# Patient Record
Sex: Female | Born: 1995 | Race: Black or African American | Hispanic: Yes | Smoking: Never smoker
Health system: Southern US, Community
[De-identification: ages and names within clinical notes are randomized; demographics above are authoritative.]

## PROBLEM LIST (undated history)

## (undated) DIAGNOSIS — D219 Benign neoplasm of connective and other soft tissue, unspecified: Secondary | ICD-10-CM

## (undated) DIAGNOSIS — R011 Cardiac murmur, unspecified: Secondary | ICD-10-CM

## (undated) DIAGNOSIS — D649 Anemia, unspecified: Secondary | ICD-10-CM

## (undated) DIAGNOSIS — E669 Obesity, unspecified: Secondary | ICD-10-CM

## (undated) DIAGNOSIS — R42 Dizziness and giddiness: Secondary | ICD-10-CM

## (undated) DIAGNOSIS — L732 Hidradenitis suppurativa: Secondary | ICD-10-CM

## (undated) DIAGNOSIS — R519 Headache, unspecified: Secondary | ICD-10-CM

## (undated) HISTORY — DX: Anemia, unspecified: D64.9

## (undated) HISTORY — DX: Hidradenitis suppurativa: L73.2

## (undated) HISTORY — DX: Headache, unspecified: R51.9

## (undated) HISTORY — DX: Cardiac murmur, unspecified: R01.1

## (undated) HISTORY — DX: Obesity, unspecified: E66.9

## (undated) HISTORY — DX: Benign neoplasm of connective and other soft tissue, unspecified: D21.9

## (undated) HISTORY — PX: TEAR DUCT PROBING: SHX793

---

## 2013-07-06 HISTORY — PX: WISDOM TOOTH EXTRACTION: SHX21

## 2020-07-15 ENCOUNTER — Emergency Department (HOSPITAL_COMMUNITY)
Admission: EM | Admit: 2020-07-15 | Discharge: 2020-07-15 | Disposition: A | Discharge status: Home / Self Care | Payer: Medicaid Other | Attending: Emergency Medicine | Admitting: Emergency Medicine

## 2020-07-15 ENCOUNTER — Other Ambulatory Visit: Payer: Self-pay

## 2020-07-15 ENCOUNTER — Encounter (HOSPITAL_COMMUNITY): Payer: Self-pay

## 2020-07-15 DIAGNOSIS — Z5321 Procedure and treatment not carried out due to patient leaving prior to being seen by health care provider: Secondary | ICD-10-CM | POA: Diagnosis not present

## 2020-07-15 DIAGNOSIS — R42 Dizziness and giddiness: Secondary | ICD-10-CM | POA: Diagnosis not present

## 2020-07-15 DIAGNOSIS — Z3201 Encounter for pregnancy test, result positive: Secondary | ICD-10-CM | POA: Diagnosis not present

## 2020-07-15 LAB — I-STAT BETA HCG BLOOD, ED (MC, WL, AP ONLY): I-stat hCG, quantitative: >2000 m[IU]/mL — ABNORMAL HIGH (ref <5)

## 2020-07-15 NOTE — ED Notes (Signed)
Pt notified staff that she was leaving 

## 2020-07-15 NOTE — ED Triage Notes (Addendum)
Pt here today requesting a pregnancy test. Pt had a positive pregnancy test at home 2 weeks ago.  Pt also having concerns with her vertigo

## 2020-08-12 LAB — OB RESULTS CONSOLE RPR: RPR: NONREACTIVE

## 2020-08-12 LAB — OB RESULTS CONSOLE HIV ANTIBODY (ROUTINE TESTING): HIV: NONREACTIVE

## 2020-08-12 LAB — OB RESULTS CONSOLE ABO/RH: RH Type: POSITIVE

## 2020-08-12 LAB — OB RESULTS CONSOLE HEPATITIS B SURFACE ANTIGEN: Hepatitis B Surface Ag: NEGATIVE

## 2020-08-12 LAB — OB RESULTS CONSOLE RUBELLA ANTIBODY, IGM: Rubella: IMMUNE

## 2020-08-12 LAB — OB RESULTS CONSOLE ANTIBODY SCREEN: Antibody Screen: NEGATIVE

## 2020-08-12 LAB — OB RESULTS CONSOLE GC/CHLAMYDIA
Chlamydia: NEGATIVE
Gonorrhea: NEGATIVE

## 2020-08-24 ENCOUNTER — Encounter (HOSPITAL_COMMUNITY): Payer: Self-pay | Admitting: Emergency Medicine

## 2020-08-24 ENCOUNTER — Other Ambulatory Visit: Payer: Self-pay

## 2020-08-24 ENCOUNTER — Emergency Department (HOSPITAL_COMMUNITY)
Admission: EM | Admit: 2020-08-24 | Discharge: 2020-08-24 | Disposition: A | Discharge status: Home / Self Care | Payer: Medicaid Other | Attending: Emergency Medicine | Admitting: Emergency Medicine

## 2020-08-24 DIAGNOSIS — R1031 Right lower quadrant pain: Secondary | ICD-10-CM | POA: Diagnosis present

## 2020-08-24 DIAGNOSIS — R110 Nausea: Secondary | ICD-10-CM | POA: Diagnosis not present

## 2020-08-24 DIAGNOSIS — R102 Pelvic and perineal pain: Secondary | ICD-10-CM | POA: Diagnosis not present

## 2020-08-24 LAB — COMPREHENSIVE METABOLIC PANEL
ALT: 10 U/L (ref 0–44)
AST: 12 U/L — ABNORMAL LOW (ref 15–41)
Albumin: 3.5 g/dL (ref 3.5–5.0)
Alkaline Phosphatase: 37 U/L — ABNORMAL LOW (ref 38–126)
Anion gap: 8 (ref 5–15)
BUN: 11 mg/dL (ref 6–20)
CO2: 23 mmol/L (ref 22–32)
Calcium: 8.9 mg/dL (ref 8.9–10.3)
Chloride: 106 mmol/L (ref 98–111)
Creatinine, Ser: 0.63 mg/dL (ref 0.44–1.00)
GFR, Estimated: >60 mL/min (ref >60)
Glucose, Bld: 91 mg/dL (ref 70–99)
Potassium: 3.8 mmol/L (ref 3.5–5.1)
Sodium: 137 mmol/L (ref 135–145)
Total Bilirubin: 0.4 mg/dL (ref 0.3–1.2)
Total Protein: 6.6 g/dL (ref 6.5–8.1)

## 2020-08-24 LAB — URINALYSIS, ROUTINE W REFLEX MICROSCOPIC
Bilirubin Urine: NEGATIVE
Glucose, UA: NEGATIVE mg/dL
Hgb urine dipstick: NEGATIVE
Ketones, ur: NEGATIVE mg/dL
Leukocytes,Ua: NEGATIVE
Nitrite: NEGATIVE
Protein, ur: NEGATIVE mg/dL
Specific Gravity, Urine: 1.015 (ref 1.005–1.030)
pH: 7 (ref 5.0–8.0)

## 2020-08-24 LAB — CBC
HCT: 35.4 % — ABNORMAL LOW (ref 36.0–46.0)
Hemoglobin: 11.6 g/dL — ABNORMAL LOW (ref 12.0–15.0)
MCH: 28.4 pg (ref 26.0–34.0)
MCHC: 32.8 g/dL (ref 30.0–36.0)
MCV: 86.6 fL (ref 80.0–100.0)
Platelets: 195 10*3/uL (ref 150–400)
RBC: 4.09 MIL/uL (ref 3.87–5.11)
RDW: 12.5 % (ref 11.5–15.5)
WBC: 8.9 10*3/uL (ref 4.0–10.5)
nRBC: 0 % (ref 0.0–0.2)

## 2020-08-24 LAB — HCG, QUANTITATIVE, PREGNANCY: hCG, Beta Chain, Quant, S: 51333 m[IU]/mL — ABNORMAL HIGH (ref <5)

## 2020-08-24 LAB — LIPASE, BLOOD: Lipase: 34 U/L (ref 11–51)

## 2020-08-24 MED ORDER — AZITHROMYCIN 250 MG PO TABS
1000.0000 mg | ORAL_TABLET | Freq: Once | ORAL | Status: AC
  Administered 2020-08-24: 1000 mg via ORAL
  Filled 2020-08-24: qty 4

## 2020-08-24 NOTE — ED Notes (Signed)
Pt provided labeled specimen cup for urine collection. [employer]

## 2020-08-24 NOTE — ED Triage Notes (Signed)
Patient complaining of right side abdominal pain that started 40 min ago. Patient states that it is a shooting pain.

## 2020-08-24 NOTE — ED Provider Notes (Signed)
[facility] DEPT Provider Note   CSN: [id number] Arrival date & time: 08/24/20  0035     History Chief Complaint  Patient presents with  . Abdominal Pain    Kimberly Padilla is a 25 y.o. female.   Abdominal Pain Pain location:  RLQ Pain quality: aching, sharp and shooting   Pain radiates to:  Does not radiate Pain severity:  Moderate Duration:  1 hour Timing:  Constant Chronicity:  New Context: not trauma   Relieved by:  Nothing Worsened by:  Nothing Ineffective treatments:  None tried Associated symptoms: nausea   Associated symptoms: no anorexia, no chest pain, no chills, no cough, no diarrhea, no dysuria, no fever, no shortness of breath, no vaginal bleeding, no vaginal discharge and no vomiting        History reviewed. No pertinent past medical history.  There are no problems to display for this patient.   History reviewed. No pertinent surgical history.   OB History    Gravida  1   Para      Term      Preterm      AB      Living        SAB      IAB      Ectopic      Multiple      Live Births              History reviewed. No pertinent family history.  Social History   Tobacco Use  . Smoking status: Never Smoker  . Smokeless tobacco: Never Used  Substance Use Topics  . Alcohol use: Never  . Drug use: Never    Home Medications Prior to Admission medications   Not on File    Allergies    Patient has no known allergies.  Review of Systems   Review of Systems  Constitutional: Negative for chills and fever.  HENT: Negative for congestion and rhinorrhea.   Respiratory: Negative for cough and shortness of breath.   Cardiovascular: Negative for chest pain and palpitations.  Gastrointestinal: Positive for abdominal pain and nausea. Negative for anorexia, diarrhea and vomiting.  Genitourinary: Positive for pelvic pain. Negative for difficulty urinating, dysuria, vaginal bleeding and vaginal  discharge.  Musculoskeletal: Negative for arthralgias and back pain.  Skin: Negative for rash and wound.  Neurological: Negative for light-headedness and headaches.    Physical Exam Updated Vital Signs BP 109/61   Pulse 76   Temp 98.1 F (36.7 C) (Oral)   Resp 15   Ht 5\' 8"  (1.727 m)   Wt 106.6 kg   LMP 04/14/2020   SpO2 99%   BMI 35.73 kg/m   Physical Exam Vitals and nursing note reviewed. Exam conducted with a chaperone present.  Constitutional:      General: She is not in acute distress.    Appearance: Normal appearance.  HENT:     Head: Normocephalic and atraumatic.     Nose: No rhinorrhea.  Eyes:     General:        Right eye: No discharge.        Left eye: No discharge.     Conjunctiva/sclera: Conjunctivae normal.  Cardiovascular:     Rate and Rhythm: Normal rate and regular rhythm.  Pulmonary:     Effort: Pulmonary effort is normal. No respiratory distress.     Breath sounds: No stridor.  Abdominal:     General: Abdomen is flat. There is no distension.  Palpations: Abdomen is soft.     Tenderness: There is no guarding or rebound. Negative signs include Murphy's sign and Rovsing's sign.  Musculoskeletal:        General: No tenderness or signs of injury.  Skin:    General: Skin is warm and dry.  Neurological:     General: No focal deficit present.     Mental Status: She is alert. Mental status is at baseline.     Motor: No weakness.  Psychiatric:        Mood and Affect: Mood normal.        Behavior: Behavior normal.     ED Results / Procedures / Treatments   Labs (all labs ordered are listed, but only abnormal results are displayed) Labs Reviewed  COMPREHENSIVE METABOLIC PANEL - Abnormal; Notable for the following components:      Result Value   AST 12 (*)    Alkaline Phosphatase 37 (*)    All other components within normal limits  CBC - Abnormal; Notable for the following components:   Hemoglobin 11.6 (*)    HCT 35.4 (*)    All other  components within normal limits  HCG, QUANTITATIVE, PREGNANCY - Abnormal; Notable for the following components:   hCG, Beta Chain, Quant, S 51,333 (*)    All other components within normal limits  LIPASE, BLOOD  URINALYSIS, ROUTINE W REFLEX MICROSCOPIC    EKG None  Radiology No results found.  Procedures Procedures   Medications Ordered in ED Medications  azithromycin (ZITHROMAX) tablet 1,000 mg (1,000 mg Oral Given 08/24/20 0248)    ED Course  I have reviewed the triage vital signs and the nursing notes.  Pertinent labs & imaging results that were available during my care of the patient were reviewed by me and considered in my medical decision making (see chart for details).    MDM Rules/Calculators/A&P                          14w preg G1P0, with sudden onset right pelvic pain, no bleeding, no trauma no recent illness.  Recently diagnosed with chlamydia, not treated yet.  Patient's abdomen is soft no signs of peritonitis.  We will get urine studies as she is having some urinary frequency.  No vaginal discharge.  Has had a confirmatory ultrasound.  Patient's pregnancy is not a viable age.  Will likely help follow-up laboratory studies are unremarkable.  Laboratory studies are unremarkable for any acute changes.  Show concerning signs for infection.  Fetal heart tones are documented at 150.  Patient is feeling much better.  She states this all started after some stressful life events and she is feeling better now.  She is given follow-up information for emergent OB care and invited to come back to [place] at any point.  She agrees she is safe for discharge strict return precautions given she is treated for chlamydia as she had a positive test at home.  No further testing needed now she has good OB follow-up.  Final Clinical Impression(s) / ED Diagnoses Final diagnoses:  Pelvic pain    Rx / DC Orders ED Discharge Orders    None       Breck Coons, MD 08/24/20 [phone]

## 2020-08-24 NOTE — ED Notes (Signed)
Fetal Doppler heart rate 150.

## 2020-08-24 NOTE — Discharge Instructions (Addendum)
[facility] [address] [place],  [place]  [address]  Phone number: [phone]

## 2020-09-10 ENCOUNTER — Other Ambulatory Visit: Payer: Self-pay | Admitting: Obstetrics & Gynecology

## 2020-09-10 DIAGNOSIS — O99212 Obesity complicating pregnancy, second trimester: Secondary | ICD-10-CM

## 2020-09-30 ENCOUNTER — Encounter: Payer: Self-pay | Admitting: *Deleted

## 2020-10-01 ENCOUNTER — Other Ambulatory Visit: Payer: Self-pay

## 2020-10-01 ENCOUNTER — Ambulatory Visit: Payer: Medicaid Other | Admitting: *Deleted

## 2020-10-01 ENCOUNTER — Ambulatory Visit: Payer: Medicaid Other | Attending: Obstetrics and Gynecology

## 2020-10-01 ENCOUNTER — Other Ambulatory Visit: Payer: Self-pay | Admitting: *Deleted

## 2020-10-01 ENCOUNTER — Encounter: Payer: Self-pay | Admitting: *Deleted

## 2020-10-01 VITALS — BP 127/49 | HR 85

## 2020-10-01 DIAGNOSIS — D259 Leiomyoma of uterus, unspecified: Secondary | ICD-10-CM

## 2020-10-01 DIAGNOSIS — E669 Obesity, unspecified: Secondary | ICD-10-CM

## 2020-10-01 DIAGNOSIS — O99212 Obesity complicating pregnancy, second trimester: Secondary | ICD-10-CM | POA: Insufficient documentation

## 2020-10-01 DIAGNOSIS — Z363 Encounter for antenatal screening for malformations: Secondary | ICD-10-CM | POA: Diagnosis not present

## 2020-10-01 DIAGNOSIS — O3412 Maternal care for benign tumor of corpus uteri, second trimester: Secondary | ICD-10-CM

## 2020-10-01 DIAGNOSIS — D219 Benign neoplasm of connective and other soft tissue, unspecified: Secondary | ICD-10-CM

## 2020-10-01 DIAGNOSIS — Z3A19 19 weeks gestation of pregnancy: Secondary | ICD-10-CM

## 2020-10-01 DIAGNOSIS — O321XX0 Maternal care for breech presentation, not applicable or unspecified: Secondary | ICD-10-CM

## 2020-10-29 ENCOUNTER — Other Ambulatory Visit: Payer: Self-pay

## 2020-10-29 ENCOUNTER — Encounter: Payer: Self-pay | Admitting: *Deleted

## 2020-10-29 ENCOUNTER — Other Ambulatory Visit: Payer: Self-pay | Admitting: *Deleted

## 2020-10-29 ENCOUNTER — Ambulatory Visit: Payer: Medicaid Other | Admitting: *Deleted

## 2020-10-29 ENCOUNTER — Ambulatory Visit: Payer: Medicaid Other | Attending: Obstetrics

## 2020-10-29 VITALS — BP 114/53 | HR 78

## 2020-10-29 DIAGNOSIS — O99212 Obesity complicating pregnancy, second trimester: Secondary | ICD-10-CM | POA: Diagnosis not present

## 2020-10-29 DIAGNOSIS — O3412 Maternal care for benign tumor of corpus uteri, second trimester: Secondary | ICD-10-CM | POA: Diagnosis not present

## 2020-10-29 DIAGNOSIS — D219 Benign neoplasm of connective and other soft tissue, unspecified: Secondary | ICD-10-CM

## 2020-10-29 DIAGNOSIS — Z3A23 23 weeks gestation of pregnancy: Secondary | ICD-10-CM | POA: Insufficient documentation

## 2020-10-29 DIAGNOSIS — E669 Obesity, unspecified: Secondary | ICD-10-CM

## 2020-10-29 DIAGNOSIS — O289 Unspecified abnormal findings on antenatal screening of mother: Secondary | ICD-10-CM | POA: Diagnosis not present

## 2020-10-29 DIAGNOSIS — D251 Intramural leiomyoma of uterus: Secondary | ICD-10-CM | POA: Diagnosis not present

## 2020-10-29 DIAGNOSIS — D259 Leiomyoma of uterus, unspecified: Secondary | ICD-10-CM

## 2020-12-20 ENCOUNTER — Other Ambulatory Visit: Payer: Self-pay

## 2020-12-20 ENCOUNTER — Ambulatory Visit: Payer: Medicaid Other | Attending: Obstetrics and Gynecology

## 2020-12-20 ENCOUNTER — Ambulatory Visit: Payer: Medicaid Other | Admitting: *Deleted

## 2020-12-20 ENCOUNTER — Encounter: Payer: Self-pay | Admitting: *Deleted

## 2020-12-20 VITALS — BP 123/55 | HR 89

## 2020-12-20 DIAGNOSIS — O3413 Maternal care for benign tumor of corpus uteri, third trimester: Secondary | ICD-10-CM

## 2020-12-20 DIAGNOSIS — O99213 Obesity complicating pregnancy, third trimester: Secondary | ICD-10-CM

## 2020-12-20 DIAGNOSIS — O289 Unspecified abnormal findings on antenatal screening of mother: Secondary | ICD-10-CM | POA: Diagnosis not present

## 2020-12-20 DIAGNOSIS — D219 Benign neoplasm of connective and other soft tissue, unspecified: Secondary | ICD-10-CM | POA: Diagnosis present

## 2020-12-20 DIAGNOSIS — Z362 Encounter for other antenatal screening follow-up: Secondary | ICD-10-CM

## 2020-12-20 DIAGNOSIS — D259 Leiomyoma of uterus, unspecified: Secondary | ICD-10-CM

## 2020-12-20 DIAGNOSIS — E669 Obesity, unspecified: Secondary | ICD-10-CM

## 2020-12-20 DIAGNOSIS — O321XX0 Maternal care for breech presentation, not applicable or unspecified: Secondary | ICD-10-CM

## 2020-12-20 DIAGNOSIS — Z3A31 31 weeks gestation of pregnancy: Secondary | ICD-10-CM

## 2021-01-08 ENCOUNTER — Other Ambulatory Visit: Payer: Self-pay

## 2021-01-08 ENCOUNTER — Inpatient Hospital Stay (HOSPITAL_COMMUNITY)
Admission: AD | Admit: 2021-01-08 | Discharge: 2021-01-09 | Disposition: A | Discharge status: Home / Self Care | Payer: Medicaid Other | Attending: Obstetrics | Admitting: Obstetrics

## 2021-01-08 DIAGNOSIS — Z20822 Contact with and (suspected) exposure to covid-19: Secondary | ICD-10-CM | POA: Diagnosis not present

## 2021-01-08 DIAGNOSIS — O99891 Other specified diseases and conditions complicating pregnancy: Secondary | ICD-10-CM | POA: Diagnosis not present

## 2021-01-08 DIAGNOSIS — O99213 Obesity complicating pregnancy, third trimester: Secondary | ICD-10-CM | POA: Diagnosis not present

## 2021-01-08 DIAGNOSIS — Z7982 Long term (current) use of aspirin: Secondary | ICD-10-CM | POA: Insufficient documentation

## 2021-01-08 DIAGNOSIS — O99283 Endocrine, nutritional and metabolic diseases complicating pregnancy, third trimester: Secondary | ICD-10-CM | POA: Insufficient documentation

## 2021-01-08 DIAGNOSIS — Z3A34 34 weeks gestation of pregnancy: Secondary | ICD-10-CM | POA: Diagnosis not present

## 2021-01-08 DIAGNOSIS — R42 Dizziness and giddiness: Secondary | ICD-10-CM | POA: Diagnosis not present

## 2021-01-08 DIAGNOSIS — E860 Dehydration: Secondary | ICD-10-CM | POA: Insufficient documentation

## 2021-01-08 DIAGNOSIS — O26893 Other specified pregnancy related conditions, third trimester: Secondary | ICD-10-CM | POA: Insufficient documentation

## 2021-01-08 HISTORY — DX: Dizziness and giddiness: R42

## 2021-01-08 MED ORDER — LACTATED RINGERS IV SOLN: Freq: Once | INTRAVENOUS | Status: AC

## 2021-01-08 NOTE — MAU Note (Signed)
Having dizziness and weakness since yesterday. Sometimes feel out of breath and I am not doing anything. Some lower abd pain as well for couple days. Denies VB or LOF

## 2021-01-08 NOTE — ED Provider Notes (Signed)
Emergency Medicine Provider OB Triage Evaluation Note  Kimberly Padilla is a 25 y.o. female, G2P0010, at [redacted]w[redacted]d gestation who presents to the emergency department with complaints of lightheadedness x 2 days, worsening today. Has vertigo at baseline, but states this feels different. Took her "vertigo medicine" without relief. Has tried rest and increased PO fluids for symptoms with no change. Does note some associated chest pain and SOB yesterday. Also endorses pelvic cramping. No vaginal bleeding or discharge. Reports good fetal movement. No fevers, vomiting, diarrhea.  Review of  Systems  Positive: lightheadedness Negative: fever, V/D  Physical Exam  BP 125/77 (BP Location: Right Arm)   Pulse 94   Temp 98.1 F (36.7 C) (Oral)   Resp 19   LMP 04/14/2020   SpO2 100%  General: Awake, no distress  HEENT: Atraumatic  Resp: Normal effort  Cardiac: Normal rate Abd: Nondistended, nontender  MSK: Moves all extremities without difficulty Neuro: Speech clear  Medical Decision Making  Pt evaluated for pregnancy concern and is stable for transfer to MAU. Pt is in agreement with plan for transfer.  11:06 PM Discussed with MAU APP, Lelan Pons, who accepts patient in transfer.  Clinical Impression  Lightheadedness     Antonietta Breach, Hershal Coria 01/08/21 2309    Isla Pence, MD 01/08/21 [phone]

## 2021-01-08 NOTE — MAU Provider Note (Signed)
Chief Complaint:  Dizziness   Event Date/Time   First Provider Initiated Contact with Patient 01/08/21 2352     HPI: Kimberly Padilla is a 25 y.o. G2P0010 at 46w6dwho presents to maternity admissions reporting dizziness and weakness toay.  States occasional pelvic cramping.  Martin Majestic out ot town today but does not know where it was. States drank a lot today. Did not call her doctor.. She reports good fetal movement, denies LOF, vaginal bleeding, vaginal itching/burning, urinary symptoms, h/a, dizziness, n/v, diarrhea, constipation or fever/chills.  She denies headache, visual changes or RUQ abdominal pain.  Dizziness This is a new problem. The current episode started today. The problem occurs intermittently. The problem has been unchanged. Associated symptoms include fatigue. Pertinent negatives include no chest pain, congestion, fever, headaches, myalgias, nausea, vertigo, visual change or vomiting. Nothing aggravates the symptoms. She has tried nothing for the symptoms.   RN Note Having dizziness and weakness since yesterday. Sometimes feel out of breath and I am not doing anything. Some lower abd pain as well for couple days. Denies VB or LOF  Past Medical History: Past Medical History:  Diagnosis Date   Anemia    Fibroid    Headache    Heart murmur    Hydradenitis    Obesity     Past obstetric history: OB History  Gravida Para Term Preterm AB Living  2       1 0  SAB IAB Ectopic Multiple Live Births      1        # Outcome Date GA Lbr Len/2nd Weight Sex Delivery Anes PTL Lv  2 Current           1 Ectopic             Past Surgical History: Past Surgical History:  Procedure Laterality Date   WISDOM TOOTH EXTRACTION  2015    Family History: Family History  Problem Relation Age of Onset   Diabetes Mother    Hypertension Mother    Obesity Mother    Heart disease Father    Obesity Father    Stroke Father    Vision loss Father    Asthma Sister    Obesity Sister    Obesity  Maternal Aunt    Obesity Maternal Uncle    Obesity Paternal Aunt    Obesity Paternal Uncle    Stroke Paternal Uncle    Vision loss Paternal Uncle    Obesity Maternal Grandmother    Obesity Maternal Grandfather    Cancer Paternal Grandmother    Obesity Paternal Grandmother    Cancer Paternal Grandfather    Obesity Paternal Grandfather     Social History: Social History   Tobacco Use   Smoking status: Never   Smokeless tobacco: Never  Vaping Use   Vaping Use: Never used  Substance Use Topics   Alcohol use: Not Currently   Drug use: Never    Allergies: No Known Allergies  Meds:  Medications Prior to Admission  Medication Sig Dispense Refill Last Dose   aspirin EC 81 MG tablet Take 81 mg by mouth daily. Swallow whole.      Prenatal Vit-Fe Fumarate-FA (PRENATAL VITAMINS PO) Take by mouth.       I have reviewed patient's Past Medical Hx, Surgical Hx, Family Hx, Social Hx, medications and allergies.   ROS:  Review of Systems  Constitutional:  Positive for fatigue. Negative for fever.  HENT:  Negative for congestion.   Cardiovascular:  Negative for  chest pain.  Gastrointestinal:  Negative for nausea and vomiting.  Musculoskeletal:  Negative for myalgias.  Neurological:  Positive for dizziness. Negative for vertigo and headaches.  Other systems negative  Physical Exam  Patient Vitals for the past 24 hrs:  BP Temp Temp src Pulse Resp SpO2 Height Weight  01/08/21 2335 122/61 -- -- 91 -- -- -- --  01/08/21 2334 -- 98.3 F (36.8 C) -- -- 18 -- 5\' 8"  (1.727 m) 117.9 kg  01/08/21 2305 125/77 98.1 F (36.7 C) -- 94 16 100 % -- --  01/08/21 2300 125/77 98.1 F (36.7 C) Oral 94 19 100 % -- --  Vital signs did show orthostatic changes in Heart Rate with sitting and standing  Constitutional: Well-developed, well-nourished female in no acute distress.  Cardiovascular: normal rate and rhythm Respiratory: normal effort, clear to auscultation bilaterally GI: Abd soft,  non-tender, gravid appropriate for gestational age.   No rebound or guarding. MS: Extremities nontender, no edema, normal ROM Neurologic: Alert and oriented x 4.  GU: Neg CVAT.  PELVIC EXAM:     FHT:  Baseline 135 , moderate variability, accelerations present, no decelerations Occasional contractions   Labs: Results for orders placed or performed during the hospital encounter of 01/08/21 (from the past 24 hour(s))  Urinalysis, Routine w reflex microscopic Urine, Clean Catch     Status: Abnormal   Collection Time: 01/08/21 11:42 PM  Result Value Ref Range   Color, Urine AMBER (A) YELLOW   APPearance HAZY (A) CLEAR   Specific Gravity, Urine 1.028 1.005 - 1.030   pH 5.0 5.0 - 8.0   Glucose, UA NEGATIVE NEGATIVE mg/dL   Hgb urine dipstick NEGATIVE NEGATIVE   Bilirubin Urine NEGATIVE NEGATIVE   Ketones, ur NEGATIVE NEGATIVE mg/dL   Protein, ur NEGATIVE NEGATIVE mg/dL   Nitrite NEGATIVE NEGATIVE   Leukocytes,Ua TRACE (A) NEGATIVE   RBC / HPF 0-5 0 - 5 RBC/hpf   WBC, UA 6-10 0 - 5 WBC/hpf   Bacteria, UA NONE SEEN NONE SEEN   Squamous Epithelial / LPF 0-5 0 - 5   Mucus PRESENT   CBC     Status: Abnormal   Collection Time: 01/08/21 11:54 PM  Result Value Ref Range   WBC 9.8 4.0 - 10.5 K/uL   RBC 4.06 3.87 - 5.11 MIL/uL   Hemoglobin 11.4 (L) 12.0 - 15.0 g/dL   HCT 34.4 (L) 36.0 - 46.0 %   MCV 84.7 80.0 - 100.0 fL   MCH 28.1 26.0 - 34.0 pg   MCHC 33.1 30.0 - 36.0 g/dL   RDW 13.2 11.5 - 15.5 %   Platelets 215 150 - 400 K/uL   nRBC 0.0 0.0 - 0.2 %  Basic metabolic panel     Status: Abnormal   Collection Time: 01/08/21 11:54 PM  Result Value Ref Range   Sodium 135 135 - 145 mmol/L   Potassium 3.8 3.5 - 5.1 mmol/L   Chloride 107 98 - 111 mmol/L   CO2 20 (L) 22 - 32 mmol/L   Glucose, Bld 109 (H) 70 - 99 mg/dL   BUN 6 6 - 20 mg/dL   Creatinine, Ser 0.57 0.44 - 1.00 mg/dL   Calcium 9.0 8.9 - 10.3 mg/dL   GFR, Estimated >60 >60 mL/min   Anion gap 8 5 - 15  Resp Panel by RT-PCR  (Flu A&B, Covid) Nasopharyngeal Swab     Status: None   Collection Time: 01/09/21 12:24 AM   Specimen: Nasopharyngeal Swab; Nasopharyngeal(NP)  swabs in vial transport medium  Result Value Ref Range   SARS Coronavirus 2 by RT PCR NEGATIVE NEGATIVE   Influenza A by PCR NEGATIVE NEGATIVE   Influenza B by PCR NEGATIVE NEGATIVE       Imaging:    MAU Course/MDM: I have ordered labs and reviewed results. Covid test negative.  Other labs normal  No overt anemia or electrolyte abnormality  UA showed some dehydration NST reviewed, reassuring.  Treatments in MAU included IV hydration x 2 liters which did help her orthostasis. .    Assessment: Single IUP at [redacted]w[redacted]d Orthostatic dizziness Dehydration  Plan: Discharge home Discussed hydration using urine dilution as a guide.  Reviewed extra water needs in hot summer Preterm Labor precautions and fetal kick counts Follow up in Office for prenatal visits  Encouraged to return if she develops worsening of symptoms, increase in pain, fever, or other concerning symptoms.   Pt stable at time of discharge.  Hansel Feinstein CNM, MSN Certified Nurse-Midwife 01/08/2021 11:52 PM

## 2021-01-09 ENCOUNTER — Encounter (HOSPITAL_COMMUNITY): Payer: Self-pay | Admitting: Obstetrics

## 2021-01-09 LAB — RESP PANEL BY RT-PCR (FLU A&B, COVID) ARPGX2
Influenza A by PCR: NEGATIVE
Influenza B by PCR: NEGATIVE
SARS Coronavirus 2 by RT PCR: NEGATIVE

## 2021-01-09 LAB — URINALYSIS, ROUTINE W REFLEX MICROSCOPIC
Bacteria, UA: NONE SEEN
Bilirubin Urine: NEGATIVE
Glucose, UA: NEGATIVE mg/dL
Hgb urine dipstick: NEGATIVE
Ketones, ur: NEGATIVE mg/dL
Nitrite: NEGATIVE
Protein, ur: NEGATIVE mg/dL
Specific Gravity, Urine: 1.028 (ref 1.005–1.030)
pH: 5 (ref 5.0–8.0)

## 2021-01-09 LAB — BASIC METABOLIC PANEL
Anion gap: 8 (ref 5–15)
BUN: 6 mg/dL (ref 6–20)
CO2: 20 mmol/L — ABNORMAL LOW (ref 22–32)
Calcium: 9 mg/dL (ref 8.9–10.3)
Chloride: 107 mmol/L (ref 98–111)
Creatinine, Ser: 0.57 mg/dL (ref 0.44–1.00)
GFR, Estimated: >60 mL/min (ref >60)
Glucose, Bld: 109 mg/dL — ABNORMAL HIGH (ref 70–99)
Potassium: 3.8 mmol/L (ref 3.5–5.1)
Sodium: 135 mmol/L (ref 135–145)

## 2021-01-09 LAB — CBC
HCT: 34.4 % — ABNORMAL LOW (ref 36.0–46.0)
Hemoglobin: 11.4 g/dL — ABNORMAL LOW (ref 12.0–15.0)
MCH: 28.1 pg (ref 26.0–34.0)
MCHC: 33.1 g/dL (ref 30.0–36.0)
MCV: 84.7 fL (ref 80.0–100.0)
Platelets: 215 10*3/uL (ref 150–400)
RBC: 4.06 MIL/uL (ref 3.87–5.11)
RDW: 13.2 % (ref 11.5–15.5)
WBC: 9.8 10*3/uL (ref 4.0–10.5)
nRBC: 0 % (ref 0.0–0.2)

## 2021-01-09 MED ORDER — LACTATED RINGERS IV SOLN: Freq: Once | INTRAVENOUS | Status: AC

## 2021-01-09 NOTE — MAU Note (Signed)
Patient presented to MAU reporting dizziness and SOB since yesterday 7/5. Patient reports having vertigo, however, stated she has not had a fall. Pt denies VB, DFM, CTX, and LOF.

## 2021-01-28 LAB — OB RESULTS CONSOLE GBS: GBS: POSITIVE

## 2021-02-01 ENCOUNTER — Inpatient Hospital Stay (HOSPITAL_BASED_OUTPATIENT_CLINIC_OR_DEPARTMENT_OTHER): Payer: Medicaid Other

## 2021-02-01 ENCOUNTER — Other Ambulatory Visit: Payer: Self-pay

## 2021-02-01 ENCOUNTER — Inpatient Hospital Stay (HOSPITAL_COMMUNITY)
Admission: AD | Admit: 2021-02-01 | Discharge: 2021-02-01 | Disposition: A | Discharge status: Home / Self Care | Payer: Medicaid Other | Attending: Obstetrics & Gynecology | Admitting: Obstetrics & Gynecology

## 2021-02-01 ENCOUNTER — Encounter (HOSPITAL_COMMUNITY): Payer: Self-pay | Admitting: Obstetrics & Gynecology

## 2021-02-01 DIAGNOSIS — O3413 Maternal care for benign tumor of corpus uteri, third trimester: Secondary | ICD-10-CM | POA: Diagnosis not present

## 2021-02-01 DIAGNOSIS — Z3689 Encounter for other specified antenatal screening: Secondary | ICD-10-CM | POA: Diagnosis not present

## 2021-02-01 DIAGNOSIS — E669 Obesity, unspecified: Secondary | ICD-10-CM | POA: Diagnosis not present

## 2021-02-01 DIAGNOSIS — Z3A37 37 weeks gestation of pregnancy: Secondary | ICD-10-CM | POA: Diagnosis not present

## 2021-02-01 DIAGNOSIS — O99213 Obesity complicating pregnancy, third trimester: Secondary | ICD-10-CM | POA: Diagnosis not present

## 2021-02-01 DIAGNOSIS — O283 Abnormal ultrasonic finding on antenatal screening of mother: Secondary | ICD-10-CM

## 2021-02-01 DIAGNOSIS — O368130 Decreased fetal movements, third trimester, not applicable or unspecified: Secondary | ICD-10-CM | POA: Diagnosis present

## 2021-02-01 DIAGNOSIS — O368190 Decreased fetal movements, unspecified trimester, not applicable or unspecified: Secondary | ICD-10-CM

## 2021-02-01 DIAGNOSIS — Z362 Encounter for other antenatal screening follow-up: Secondary | ICD-10-CM

## 2021-02-01 DIAGNOSIS — D259 Leiomyoma of uterus, unspecified: Secondary | ICD-10-CM

## 2021-02-01 LAB — WET PREP, GENITAL
Sperm: NONE SEEN
Trich, Wet Prep: NONE SEEN
Yeast Wet Prep HPF POC: NONE SEEN

## 2021-02-01 LAB — POCT FERN TEST: POCT Fern Test: NEGATIVE

## 2021-02-01 MED ORDER — METRONIDAZOLE 0.75 % VA GEL: 1.0000 | Freq: Every day | VAGINAL | 1 refills | Status: DC

## 2021-02-01 NOTE — MAU Provider Note (Signed)
Chief Complaint:  Decreased Fetal Movement   None    HPI: Kimberly Padilla is a 25 y.o. G2P0010 at 49w2dwho presents to maternity admissions reporting no fetal movement. Patient reports yesterday fetal movement was not as frequent or as strong as it usually is. Today she reports no fetal movement since she work up around 12:30-1:00 pm. Reports she had some coffee and crackers prior to her arrival because that usually wakes the baby up. Prior to that, patient reports she had a t[place] egg, and cheese sandwich and apple juice at 2:00am. She denies pain or vaginal bleeding. Reports that she has "felt more moist" than usual. Has not had to wear a pad, but "feels like I should be".   Pregnancy Course:   Past Medical History:  Diagnosis Date   Anemia    Fibroid    Headache    Heart murmur    Hydradenitis    Obesity    Vertigo    9  years   OB History  Gravida Para Term Preterm AB Living  2 0   0 1 0  SAB IAB Ectopic Multiple Live Births    0 1 0 0    # Outcome Date GA Lbr Len/2nd Weight Sex Delivery Anes PTL Lv  2 Current           1 Ectopic            Past Surgical History:  Procedure Laterality Date   WISDOM TOOTH EXTRACTION  2015   Family History  Problem Relation Age of Onset   Diabetes Mother    Hypertension Mother    Obesity Mother    Heart disease Father    Obesity Father    Stroke Father    Vision loss Father    Asthma Sister    Obesity Sister    Obesity Maternal Aunt    Obesity Maternal Uncle    Obesity Paternal Aunt    Obesity Paternal Uncle    Stroke Paternal Uncle    Vision loss Paternal Uncle    Obesity Maternal Grandmother    Obesity Maternal Grandfather    Cancer Paternal Grandmother    Obesity Paternal Grandmother    Cancer Paternal Grandfather    Obesity Paternal Grandfather    Social History   Tobacco Use   Smoking status: Never   Smokeless tobacco: Never  Vaping Use   Vaping Use: Never used  Substance Use Topics   Alcohol use: Not Currently    Drug use: Never   Allergies  Allergen Reactions   Shellfish Allergy Anaphylaxis and Hives   Other     Nuts- reaction varies Mango, berries, pineapple, kiwi- mouth itch, anaphlaxis reaction     No medications prior to admission.    I have reviewed patient's Past Medical Hx, Surgical Hx, Family Hx, Social Hx, medications and allergies.   ROS:  Review of Systems  Constitutional: Negative.   Respiratory: Negative.    Cardiovascular: Negative.   Gastrointestinal: Negative.   Genitourinary:  Positive for pelvic pain and vaginal discharge (slimy, mucoid, clear with red streak). Negative for dysuria and vaginal bleeding.  Musculoskeletal: Negative.   Neurological: Negative.   Psychiatric/Behavioral: Negative.     Physical Exam  Patient Vitals for the past 24 hrs:  BP Temp Temp src Pulse Resp SpO2 Height Weight  02/01/21 1705 (!) 110/58 -- -- 75 16 -- -- --  02/01/21 1600 -- -- -- -- -- 100 % -- --  02/01/21 1500 -- -- -- -- --  99 % -- --  02/01/21 1449 123/62 -- -- 97 16 100 % '5\' 8"'$  (1.727 m) 117.9 kg  02/01/21 1436 124/75 98.5 F (36.9 C) Oral 99 16 99 % -- --   Constitutional: well-developed, well-nourished female in no acute distress.  Cardiovascular: normal rate Respiratory: normal effort GI: abd soft, non-tender, gravid  MS: extremities nontender, no edema, normal ROM Neurologic: alert and oriented x 4.  GU: neg CVAT. Pelvic: NEFG, copious amounts of yellow tinted discharge, no pooling of amniotic fluid, no blood, cervix clean without lesions/masses, no CMT  Dilation: 1 Effacement (%): Thick Cervical Position: Posterior Station: Ballotable Presentation: Undeterminable Exam by:: Maryagnes Amos, CNM  FHT: Baseline 135 bpm, moderate variability, + 15x15 accels, no decels Toco: quiet   Labs: Results for orders placed or performed during the hospital encounter of 02/01/21 (from the past 24 hour(s))  Wet prep, genital     Status: Abnormal   Collection Time:  02/01/21  3:19 PM   Specimen: Cervix  Result Value Ref Range   Yeast Wet Prep HPF POC NONE SEEN NONE SEEN   Trich, Wet Prep NONE SEEN NONE SEEN   Clue Cells Wet Prep HPF POC PRESENT (A) NONE SEEN   WBC, Wet Prep HPF POC MANY (A) NONE SEEN   Sperm NONE SEEN   Fern Test     Status: None   Collection Time: 02/01/21  3:19 PM  Result Value Ref Range   POCT Fern Test Negative = intact amniotic membranes     Imaging:  No results found.  MAU Course: Orders Placed This Encounter  Procedures   Wet prep, genital   [place] MFM FETAL BPP WO NON STRESS   Fern Test   Discharge patient    Meds ordered this encounter  Medications   metroNIDAZOLE (METROGEL) 0.75 % vaginal gel    Sig: Place 1 Applicatorful vaginally at bedtime. Apply one applicatorful to vagina at bedtime for 5 days    Dispense:  70 g    Refill:  1    Order Specific Question:   Supervising Provider    Answer:   Merrily Pew    MDM: PO hydration Fern negative Wet prep + clue cells NST reactive and reassuring Patient given NST "clicker" and pushed 4x in 1 hour.  BPP 8/8 with normal AFI Patient reports "a lot of movement" after return from BPP   Assessment: 1. Decreased fetal movements in third trimester, single or unspecified fetus   2. Decreased fetal movement   3. NST (non-stress test) reactive     Plan: Discharge home in stable condition  Labor precautions and fetal kick counts reviewed Rx for BV sent to pharmacy Keep appointment as scheduled on 8/3 Return to MAU as needed    Follow-up Information     Ob/Gyn, [facility] Follow up.   Why: as scheduled on 8/3. Return to MAU as needed. Contact information: [place] [address] [phone]                 Allergies as of 02/01/2021       Reactions   Shellfish Allergy Anaphylaxis, Hives   Other    Nuts- reaction varies Mango, berries, pineapple, kiwi- mouth itch, anaphlaxis reaction        Medication  List     TAKE these medications    aspirin EC 81 MG tablet Take 81 mg by mouth daily. Swallow whole.   cholecalciferol 25 MCG (1000 UNIT)  tablet Commonly known as: VITAMIN D3 Take 1,000 Units by mouth daily.   metroNIDAZOLE 0.75 % vaginal gel Commonly known as: METROGEL Place 1 Applicatorful vaginally at bedtime. Apply one applicatorful to vagina at bedtime for 5 days   PRENATAL VITAMINS PO Take by mouth.         Maryagnes Amos, MSN, CNM 02/01/2021 5:21 PM

## 2021-02-01 NOTE — MAU Note (Signed)
Kimberly Padilla is a 25 y.o. at 55w2dhere in MAU reporting: DFM yesterday and no FM today, states she woke up around 06/1229. Denies pain, bleeding, or LOF. Has been having some mucus discharge.   Onset of complaint: yesterday  Pain score: 0/10  Vitals:   02/01/21 1436  BP: 124/75  Pulse: 99  Resp: 16  Temp: 98.5 F (36.9 C)  SpO2: 99%     FHT:138  Lab orders placed from triage: none

## 2021-02-13 ENCOUNTER — Encounter (HOSPITAL_COMMUNITY): Payer: Self-pay | Admitting: Obstetrics & Gynecology

## 2021-02-13 ENCOUNTER — Inpatient Hospital Stay (HOSPITAL_COMMUNITY)
Admission: AD | Admit: 2021-02-13 | Discharge: 2021-02-13 | Disposition: A | Discharge status: Home / Self Care | Payer: Medicaid Other | Attending: Obstetrics & Gynecology | Admitting: Obstetrics & Gynecology

## 2021-02-13 DIAGNOSIS — Z3A39 39 weeks gestation of pregnancy: Secondary | ICD-10-CM

## 2021-02-13 DIAGNOSIS — O471 False labor at or after 37 completed weeks of gestation: Secondary | ICD-10-CM | POA: Diagnosis not present

## 2021-02-13 DIAGNOSIS — O479 False labor, unspecified: Secondary | ICD-10-CM

## 2021-02-13 DIAGNOSIS — N930 Postcoital and contact bleeding: Secondary | ICD-10-CM

## 2021-02-13 DIAGNOSIS — O99891 Other specified diseases and conditions complicating pregnancy: Secondary | ICD-10-CM | POA: Diagnosis not present

## 2021-02-13 DIAGNOSIS — O4693 Antepartum hemorrhage, unspecified, third trimester: Secondary | ICD-10-CM

## 2021-02-13 NOTE — MAU Note (Addendum)
Was seen at office today and had sve and 1cm but is breech. Scheduled for c/s on Tues. After sve has had some bleeding since the exam and was concerned. Called and told to come in . Some cramping/ctx and pelvic pressure. Good FM

## 2021-02-13 NOTE — MAU Provider Note (Signed)
Chief Complaint:  Vaginal Bleeding   Event Date/Time   First Provider Initiated Contact with Patient 02/13/21 2050     HPI: Kimberly Padilla is a 25 y.o. G2P0010 at 80w0dho presents to maternity admissions reporting light bleeding since being examined earlier today.  Baby is breech and cervix was checked today.  Having mild contractions.  . She reports good fetal movement, denies LOF, vaginal itching/burning, urinary symptoms, h/a, dizziness, n/v, diarrhea, constipation or fever/chills.    Vaginal Bleeding The patient's primary symptoms include pelvic pain and vaginal bleeding. The patient's pertinent negatives include no genital itching, genital lesions or genital odor. This is a new problem. The current episode started today. The problem has been rapidly improving. The pain is mild. She is pregnant. Pertinent negatives include no constipation, diarrhea, fever, frequency or headaches. The vaginal discharge was bloody. The vaginal bleeding is lighter than menses. She has not been passing clots. She has not been passing tissue. Nothing aggravates the symptoms.   RN Note Was seen at office today and had sve and 1cm but is breech. Scheduled for c/s on Tues. After sve has had some bleeding since the exam and was concerned. Called and told to come in . Some cramping/ctx and pelvic pressure. Good FM  Past Medical History: Past Medical History:  Diagnosis Date   Anemia    Fibroid    Headache    Heart murmur    Hydradenitis    Obesity    Vertigo    9  years    Past obstetric history: OB History  Gravida Para Term Preterm AB Living  2 0   0 1 0  SAB IAB Ectopic Multiple Live Births    0 1 0 0    # Outcome Date GA Lbr Len/2nd Weight Sex Delivery Anes PTL Lv  2 Current           1 Ectopic             Past Surgical History: Past Surgical History:  Procedure Laterality Date   WISDOM TOOTH EXTRACTION  2015    Family History: Family History  Problem Relation Age of Onset   Diabetes  Mother    Hypertension Mother    Obesity Mother    Heart disease Father    Obesity Father    Stroke Father    Vision loss Father    Asthma Sister    Obesity Sister    Obesity Maternal Aunt    Obesity Maternal Uncle    Obesity Paternal Aunt    Obesity Paternal Uncle    Stroke Paternal Uncle    Vision loss Paternal Uncle    Obesity Maternal Grandmother    Obesity Maternal Grandfather    Cancer Paternal Grandmother    Obesity Paternal Grandmother    Cancer Paternal Grandfather    Obesity Paternal Grandfather     Social History: Social History   Tobacco Use   Smoking status: Never   Smokeless tobacco: Never  Vaping Use   Vaping Use: Never used  Substance Use Topics   Alcohol use: Not Currently   Drug use: Never    Allergies:  Allergies  Allergen Reactions   Shellfish Allergy Anaphylaxis and Hives   Other     Nuts- reaction varies Mango, berries, pineapple, kiwi- mouth itch, anaphlaxis reaction      Meds:  Medications Prior to Admission  Medication Sig Dispense Refill Last Dose   aspirin EC 81 MG tablet Take 81 mg by mouth daily. Swallow  whole.   02/13/2021   cholecalciferol (VITAMIN D3) 25 MCG (1000 UNIT) tablet Take 1,000 Units by mouth daily.   02/13/2021   metroNIDAZOLE (METROGEL) 0.75 % vaginal gel Place 1 Applicatorful vaginally at bedtime. Apply one applicatorful to vagina at bedtime for 5 days 70 g 1 Past Week   Prenatal Vit-Fe Fumarate-FA (PRENATAL VITAMINS PO) Take by mouth.   02/13/2021    I have reviewed patient's Past Medical Hx, Surgical Hx, Family Hx, Social Hx, medications and allergies.   ROS:  Review of Systems  Constitutional:  Negative for fever.  Gastrointestinal:  Negative for constipation and diarrhea.  Genitourinary:  Positive for pelvic pain and vaginal bleeding. Negative for frequency.  Neurological:  Negative for headaches.  Other systems negative  Physical Exam  Patient Vitals for the past 24 hrs:  BP Temp Temp src Pulse Resp  SpO2 Height Weight  02/13/21 2046 121/74 98.2 F (36.8 C) Oral 91 18 100 % -- --  02/13/21 2026 116/66 -- -- -- -- -- -- --  02/13/21 2025 -- -- -- 83 -- 100 % -- --  02/13/21 2023 -- 97.8 F (36.6 C) -- -- 18 -- '5\' 8"'$  (1.727 m) 120.2 kg   Constitutional: Well-developed, well-nourished female in no acute distress.  Cardiovascular: normal rate and rhythm Respiratory: normal effort, clear to auscultation bilaterally GI: Abd soft, non-tender, gravid appropriate for gestational age.   No rebound or guarding. MS: Extremities nontender, no edema, normal ROM Neurologic: Alert and oriented x 4.  GU: Neg CVAT.  PELVIC EXAM: scant light red discharge, vaginal walls and external genitalia normal Dilation: 1 Effacement (%): 30 Cervical Position: Posterior Station: Ballotable Exam by:: Youlanda Roys, RN  FHT:  Baseline 140 , moderate variability, accelerations present, no decelerations Contractions: Irregular     Labs: No results found for this or any previous visit (from the past 24 hour(s)).    Imaging:    MAU Course/MDM: NST reviewed, reactive Discussed light bleeding can be normal after cervix exam.  Discussed no change in cervical dilation since today.  Not laboring.    Treatments in MAU included EFM.    Assessment: Single IUP at 15w1dPost-exam light bleeding Reactive nonstress test Irregular contractions, not In labor  Plan: Discharge home Labor precautions and fetal kick counts Follow up in Office for prenatal visits Wants to know why they just can't do C/S today. Discussed I cannot speak to this.  Recommend call office and discuss with her doctor Encouraged to return if she develops worsening of symptoms, increase in pain, fever, or other concerning symptoms.  Pt stable at time of discharge.  MHansel FeinsteinCNM, MSN Certified Nurse-Midwife 02/13/2021 8:50 PM

## 2021-02-14 ENCOUNTER — Encounter (HOSPITAL_COMMUNITY): Payer: Self-pay

## 2021-02-14 ENCOUNTER — Telehealth (HOSPITAL_COMMUNITY): Payer: Self-pay | Admitting: *Deleted

## 2021-02-14 NOTE — Patient Instructions (Addendum)
Kimberly Padilla  02/14/2021   Your procedure is scheduled on:  02/18/2021  Arrive at [place] at Entrance C on [address] at [facility]  and [address]. You are invited to use the FREE valet parking or use the Visitor's parking deck.  Pick up the phone at the desk and dial [phone].  Call this number if you have problems the morning of surgery: [phone]  Remember:   Do not eat food:(After Midnight) Desps de medianoche.  Do not drink clear liquids: (After Midnight) Desps de medianoche.  Take these medicines the morning of surgery with A SIP OF WATER: none   Do not wear jewelry, make-up or nail polish.  Do not wear lotions, powders, or perfumes. Do not wear deodorant.  Do not shave 48 hours prior to surgery.  Do not bring valuables to the hospital.  [facility] is not   responsible for any belongings or valuables brought to the hospital.  Contacts, dentures or bridgework may not be worn into surgery.  Leave suitcase in the car. After surgery it may be brought to your room.  For patients admitted to the hospital, checkout time is 11:00 AM the day of              discharge.      Please read over the following fact sheets that you were given:     Preparing for Surgery

## 2021-02-14 NOTE — Telephone Encounter (Signed)
Preadmission screen  

## 2021-02-17 ENCOUNTER — Inpatient Hospital Stay (EMERGENCY_DEPARTMENT_HOSPITAL)
Admission: AD | Admit: 2021-02-17 | Discharge: 2021-02-17 | Disposition: A | Discharge status: Home / Self Care | Payer: Medicaid Other | Source: Home / Self Care | Attending: Obstetrics and Gynecology | Admitting: Obstetrics and Gynecology

## 2021-02-17 ENCOUNTER — Encounter (HOSPITAL_COMMUNITY)
Admission: RE | Admit: 2021-02-17 | Discharge: 2021-02-17 | Disposition: A | Discharge status: Home / Self Care | Payer: Medicaid Other | Source: Ambulatory Visit | Attending: Obstetrics and Gynecology | Admitting: Obstetrics and Gynecology

## 2021-02-17 ENCOUNTER — Other Ambulatory Visit: Payer: Self-pay

## 2021-02-17 ENCOUNTER — Encounter (HOSPITAL_COMMUNITY): Payer: Self-pay | Admitting: Obstetrics and Gynecology

## 2021-02-17 DIAGNOSIS — Z20822 Contact with and (suspected) exposure to covid-19: Secondary | ICD-10-CM | POA: Insufficient documentation

## 2021-02-17 DIAGNOSIS — Z0371 Encounter for suspected problem with amniotic cavity and membrane ruled out: Secondary | ICD-10-CM | POA: Diagnosis not present

## 2021-02-17 DIAGNOSIS — Z01812 Encounter for preprocedural laboratory examination: Secondary | ICD-10-CM | POA: Insufficient documentation

## 2021-02-17 DIAGNOSIS — Z3493 Encounter for supervision of normal pregnancy, unspecified, third trimester: Secondary | ICD-10-CM

## 2021-02-17 DIAGNOSIS — Z3A39 39 weeks gestation of pregnancy: Secondary | ICD-10-CM | POA: Diagnosis not present

## 2021-02-17 DIAGNOSIS — O329XX0 Maternal care for malpresentation of fetus, unspecified, not applicable or unspecified: Secondary | ICD-10-CM | POA: Insufficient documentation

## 2021-02-17 DIAGNOSIS — O321XX0 Maternal care for breech presentation, not applicable or unspecified: Secondary | ICD-10-CM

## 2021-02-17 LAB — CBC WITH DIFFERENTIAL/PLATELET
Abs Immature Granulocytes: 0.07 10*3/uL (ref 0.00–0.07)
Basophils Absolute: 0 10*3/uL (ref 0.0–0.1)
Basophils Relative: 0 %
Eosinophils Absolute: 0.1 10*3/uL (ref 0.0–0.5)
Eosinophils Relative: 1 %
HCT: 39.2 % (ref 36.0–46.0)
Hemoglobin: 12.3 g/dL (ref 12.0–15.0)
Immature Granulocytes: 1 %
Lymphocytes Relative: 24 %
Lymphs Abs: 2.3 10*3/uL (ref 0.7–4.0)
MCH: 26.8 pg (ref 26.0–34.0)
MCHC: 31.4 g/dL (ref 30.0–36.0)
MCV: 85.4 fL (ref 80.0–100.0)
Monocytes Absolute: 0.5 10*3/uL (ref 0.1–1.0)
Monocytes Relative: 6 %
Neutro Abs: 6.3 10*3/uL (ref 1.7–7.7)
Neutrophils Relative %: 68 %
Platelets: 252 10*3/uL (ref 150–400)
RBC: 4.59 MIL/uL (ref 3.87–5.11)
RDW: 13.9 % (ref 11.5–15.5)
WBC: 9.3 10*3/uL (ref 4.0–10.5)
nRBC: 0 % (ref 0.0–0.2)

## 2021-02-17 LAB — POCT FERN TEST: POCT Fern Test: NEGATIVE

## 2021-02-17 LAB — AMNISURE RUPTURE OF MEMBRANE (ROM) NOT AT ARMC: Amnisure ROM: NEGATIVE

## 2021-02-17 LAB — TYPE AND SCREEN
ABO/RH(D): A POS
Antibody Screen: NEGATIVE

## 2021-02-17 LAB — RESP PANEL BY RT-PCR (FLU A&B, COVID) ARPGX2
Influenza A by PCR: NEGATIVE
Influenza B by PCR: NEGATIVE
SARS Coronavirus 2 by RT PCR: NEGATIVE

## 2021-02-17 NOTE — MAU Note (Signed)
.  Kimberly Padilla is a 25 y.o. at 6w4dhere in MAU reporting: LOF that started at 1030 this morning. She states the fluid was clear/white. Denies VB. Endorses good fetal movement. Scheduled for primary c/s tomorrow for breech presentation.   Pain score: 0 Vitals:   02/17/21 1103  BP: 124/74  Pulse: 96  Resp: 15  Temp: 98.3 F (36.8 C)  SpO2: 100%     FHT:124

## 2021-02-17 NOTE — Anesthesia Preprocedure Evaluation (Addendum)
Anesthesia Evaluation  Patient identified by MRN, date of birth, ID band Patient awake    Reviewed: Allergy & Precautions, NPO status , Patient's Chart, lab work & pertinent test results  Airway Mallampati: III  TM Distance: >3 FB Neck ROM: Full    Dental no notable dental hx. (+) Teeth Intact, Dental Advisory Given   Pulmonary neg pulmonary ROS,    Pulmonary exam normal breath sounds clear to auscultation       Cardiovascular negative cardio ROS Normal cardiovascular exam Rhythm:Regular Rate:Normal     Neuro/Psych  Headaches, negative psych ROS   GI/Hepatic negative GI ROS, Neg liver ROS,   Endo/Other  Morbid obesityBMI 40  Renal/GU negative Renal ROS  Female GU complaint (firboid)     Musculoskeletal negative musculoskeletal ROS (+)   Abdominal (+) + obese,   Peds  Hematology  (+) Blood dyscrasia, anemia , hct 34.4,plt 215   Anesthesia Other Findings   Reproductive/Obstetrics Breech                             Anesthesia Physical Anesthesia Plan  ASA: 3  Anesthesia Plan: Spinal   Post-op Pain Management:    Induction:   PONV Risk Score and Plan: 3 and Ondansetron, Dexamethasone and Treatment may vary due to age or medical condition  Airway Management Planned: Natural Airway and Nasal Cannula  Additional Equipment: None  Intra-op Plan:   Post-operative Plan:   Informed Consent: I have reviewed the patients History and Physical, chart, labs and discussed the procedure including the risks, benefits and alternatives for the proposed anesthesia with the patient or authorized representative who has indicated his/her understanding and acceptance.       Plan Discussed with: CRNA  Anesthesia Plan Comments:        Anesthesia Quick Evaluation

## 2021-02-17 NOTE — Pre-Procedure Instructions (Signed)
C/o leaking of fluid on arrival for visit.  Sent to MAU for evaluation after visit and lab draw

## 2021-02-17 NOTE — H&P (Addendum)
Kimberly Padilla is a 25 y.o. female G61P0010 20w4dpresenting for primary cesarean for breech presentation. She reports no LOF, VB, Contractions. Normal FM.   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  OB History     Gravida  2   Para  0   Term      Preterm  0   AB  1   Living  0      SAB      IAB  0   Ectopic  1   Multiple  0   Live Births  0          Past Medical History:  Diagnosis Date   Anemia    Fibroid    Headache    Heart murmur    Hydradenitis    Obesity    Vertigo    9  years   Past Surgical History:  Procedure Laterality Date   TEAR DUCT PROBING     WISDOM TOOTH EXTRACTION  2015   Family History: family history includes Asthma in her sister; Cancer in her paternal grandfather and paternal grandmother; Diabetes in her mother; Heart disease in her father; Hypertension in her mother; Kidney disease in her maternal grandfather and maternal grandmother; Obesity in her father, maternal aunt, maternal grandfather, maternal grandmother, maternal uncle, mother, paternal aunt, paternal grandfather, paternal grandmother, paternal uncle, and sister; Stroke in her father and paternal uncle; Vision loss in her father, maternal grandfather, maternal grandmother, and paternal uncle. Social History:  reports that she has never smoked. She has never used smokeless tobacco. She reports that she does not currently use alcohol. She reports that she does not use drugs.     Maternal Diabetes: No Genetic Screening: Declined Maternal Ultrasounds/Referrals: Other: hypoplastic nasal bone Fetal Ultrasounds or other Referrals:  Referred to Materal Fetal Medicine  Maternal Substance Abuse:  No Significant Maternal Medications:  None Significant Maternal Lab Results:  Group B Strep positive Other Comments:  None  Review of Systems Per HPI Exam Physical  Exam    Blood pressure 120/80, pulse 85, temperature 98 F (36.7 C), temperature source Oral, resp. rate 16, height '5\' 8"'$  (1.727 m), weight 121.2 kg, last menstrual period 04/14/2020, SpO2 100 %. Gen: NAD, resting comfortably CVS: RRR Lungs CTAB Abd: Gravid abdomen Ext: no calf edema or tenderness  Bedside [place] complete breech presentation  Prenatal labs: ABO, Rh:  --/--/A POS (08/15 1038) Antibody: NEG (08/15 1038) Rubella: Immune (02/07 0000) RPR: Nonreactive (02/07 0000)  HBsAg: Negative (02/07 0000)  HIV: Non-reactive (02/07 0000)  GBS: Positive/-- (07/26 0000)   Assessment/Plan: 24Y G2P0010 @ 337w5dprimary cesarean for breech Risks/benefits of cesarean discussed. Reviewed risk of infection, bleeding, damage to surroundings organs, blood transfusion, hysterectomy. Questions answered. Consent signed.  8cm fundal fibroid: starting hgb 12.3, monitor blood loss closely, low threshold for uterotonics GBS+ Ancef 3g  MiRowland Lathe/16/2022, 7:15 AM

## 2021-02-17 NOTE — MAU Provider Note (Addendum)
Event Date/Time   First Provider Initiated Contact with Patient 02/17/21 1143      S Ms. Kimberly Padilla is a 25 y.o. G2P0010 patient who presents to MAU today with complaint of leaking fluid. Fern negative from nurse exam. Baby is breech and she is scheduled for c/s tomorrow.   O BP 124/74 (BP Location: Right Arm)   Pulse 96   Temp 98.3 F (36.8 C) (Oral)   Resp 15   LMP 04/14/2020   SpO2 100%  Physical Exam Vitals reviewed. Exam conducted with a chaperone present.  Constitutional:      Appearance: Normal appearance.  HENT:     Head: Normocephalic and atraumatic.  Cardiovascular:     Rate and Rhythm: Normal rate and regular rhythm.  Abdominal:     General: Abdomen is flat. There is no distension.     Palpations: Abdomen is soft. There is no mass.     Tenderness: There is no abdominal tenderness.     Hernia: No hernia is present.  Skin:    Capillary Refill: Capillary refill takes less than 2 seconds.  Neurological:     General: No focal deficit present.     Mental Status: She is alert.  Psychiatric:        Mood and Affect: Mood normal.        Behavior: Behavior normal.        Thought Content: Thought content normal.    A Medical screening exam complete 1. [redacted] weeks gestation of pregnancy   2. Malposition and malpresentation of fetus   3. Intact amniotic membranes during pregnancy in third trimester      P Discharge from MAU in stable condition Warning signs for worsening condition that would warrant emergency follow-up discussed Patient may return to MAU as needed   Truett Mainland, DO 02/17/2021 11:43 AM

## 2021-02-18 ENCOUNTER — Encounter (HOSPITAL_COMMUNITY): Payer: Self-pay | Admitting: Obstetrics and Gynecology

## 2021-02-18 ENCOUNTER — Inpatient Hospital Stay (HOSPITAL_COMMUNITY): Payer: Medicaid Other | Admitting: Anesthesiology

## 2021-02-18 ENCOUNTER — Other Ambulatory Visit: Payer: Self-pay

## 2021-02-18 ENCOUNTER — Inpatient Hospital Stay (HOSPITAL_COMMUNITY)
Admission: RE | Admit: 2021-02-18 | Discharge: 2021-02-20 | DRG: 788 | Disposition: A | Discharge status: Home / Self Care | Payer: Medicaid Other | Attending: Obstetrics and Gynecology | Admitting: Obstetrics and Gynecology

## 2021-02-18 ENCOUNTER — Encounter (HOSPITAL_COMMUNITY)
Admission: RE | Disposition: A | Discharge status: Home / Self Care | Payer: Self-pay | Source: Home / Self Care | Attending: Obstetrics and Gynecology

## 2021-02-18 DIAGNOSIS — Z20822 Contact with and (suspected) exposure to covid-19: Secondary | ICD-10-CM | POA: Diagnosis present

## 2021-02-18 DIAGNOSIS — O99824 Streptococcus B carrier state complicating childbirth: Secondary | ICD-10-CM | POA: Diagnosis present

## 2021-02-18 DIAGNOSIS — Z3A39 39 weeks gestation of pregnancy: Secondary | ICD-10-CM | POA: Diagnosis not present

## 2021-02-18 DIAGNOSIS — O99214 Obesity complicating childbirth: Secondary | ICD-10-CM | POA: Diagnosis present

## 2021-02-18 DIAGNOSIS — O3413 Maternal care for benign tumor of corpus uteri, third trimester: Secondary | ICD-10-CM | POA: Diagnosis present

## 2021-02-18 DIAGNOSIS — D259 Leiomyoma of uterus, unspecified: Secondary | ICD-10-CM | POA: Diagnosis present

## 2021-02-18 DIAGNOSIS — O329XX0 Maternal care for malpresentation of fetus, unspecified, not applicable or unspecified: Secondary | ICD-10-CM | POA: Diagnosis present

## 2021-02-18 DIAGNOSIS — O321XX0 Maternal care for breech presentation, not applicable or unspecified: Principal | ICD-10-CM | POA: Diagnosis present

## 2021-02-18 LAB — RPR: RPR Ser Ql: NONREACTIVE

## 2021-02-18 SURGERY — Surgical Case
Anesthesia: Spinal

## 2021-02-18 MED ORDER — OXYCODONE HCL 5 MG PO TABS
5.0000 mg | ORAL_TABLET | Freq: Once | ORAL | Status: DC | PRN
Start: 2021-02-18 — End: 2021-02-18

## 2021-02-18 MED ORDER — DEXAMETHASONE SODIUM PHOSPHATE 4 MG/ML IJ SOLN
INTRAMUSCULAR | Status: AC
  Filled 2021-02-18: qty 1

## 2021-02-18 MED ORDER — BISACODYL 10 MG RE SUPP: 10.0000 mg | Freq: Every day | RECTAL | Status: DC | PRN

## 2021-02-18 MED ORDER — DIPHENHYDRAMINE HCL 25 MG PO CAPS: 25.0000 mg | ORAL_CAPSULE | ORAL | Status: DC | PRN

## 2021-02-18 MED ORDER — NALOXONE HCL 0.4 MG/ML IJ SOLN: 0.4000 mg | INTRAMUSCULAR | Status: DC | PRN

## 2021-02-18 MED ORDER — NALOXONE HCL 4 MG/10ML IJ SOLN
1.0000 ug/kg/h | INTRAVENOUS | Status: DC | PRN
  Filled 2021-02-18: qty 5

## 2021-02-18 MED ORDER — DIBUCAINE (PERIANAL) 1 % EX OINT: 1.0000 "application " | TOPICAL_OINTMENT | CUTANEOUS | Status: DC | PRN

## 2021-02-18 MED ORDER — SOD CITRATE-CITRIC ACID 500-334 MG/5ML PO SOLN
30.0000 mL | ORAL | Status: AC
  Administered 2021-02-18: 30 mL via ORAL

## 2021-02-18 MED ORDER — CEFAZOLIN IN SODIUM CHLORIDE 3-0.9 GM/100ML-% IV SOLN
3.0000 g | INTRAVENOUS | Status: AC
  Administered 2021-02-18: 2 g via INTRAVENOUS

## 2021-02-18 MED ORDER — ONDANSETRON HCL 4 MG/2ML IJ SOLN
INTRAMUSCULAR | Status: AC
  Filled 2021-02-18: qty 2

## 2021-02-18 MED ORDER — CEFAZOLIN IN SODIUM CHLORIDE 3-0.9 GM/100ML-% IV SOLN
INTRAVENOUS | Status: AC
  Filled 2021-02-18: qty 100

## 2021-02-18 MED ORDER — NALBUPHINE HCL 10 MG/ML IJ SOLN
5.0000 mg | INTRAMUSCULAR | Status: DC | PRN
Start: 2021-02-18 — End: 2021-02-20

## 2021-02-18 MED ORDER — OXYTOCIN-SODIUM CHLORIDE 30-0.9 UT/500ML-% IV SOLN
INTRAVENOUS | Status: AC
  Filled 2021-02-18: qty 500

## 2021-02-18 MED ORDER — SCOPOLAMINE 1 MG/3DAYS TD PT72
MEDICATED_PATCH | TRANSDERMAL | Status: AC
  Filled 2021-02-18: qty 1

## 2021-02-18 MED ORDER — OXYTOCIN-SODIUM CHLORIDE 30-0.9 UT/500ML-% IV SOLN: 2.5000 [IU]/h | INTRAVENOUS | Status: AC

## 2021-02-18 MED ORDER — PRENATAL MULTIVITAMIN CH
1.0000 | ORAL_TABLET | Freq: Every day | ORAL | Status: DC
  Administered 2021-02-19 – 2021-02-20 (×2): 1 via ORAL
  Filled 2021-02-18 (×2): qty 1

## 2021-02-18 MED ORDER — NALBUPHINE HCL 10 MG/ML IJ SOLN: 5.0000 mg | Freq: Once | INTRAMUSCULAR | Status: DC | PRN

## 2021-02-18 MED ORDER — DIPHENHYDRAMINE HCL 25 MG PO CAPS: 25.0000 mg | ORAL_CAPSULE | Freq: Four times a day (QID) | ORAL | Status: DC | PRN

## 2021-02-18 MED ORDER — IBUPROFEN 600 MG PO TABS
600.0000 mg | ORAL_TABLET | Freq: Four times a day (QID) | ORAL | Status: DC
  Administered 2021-02-19 – 2021-02-20 (×5): 600 mg via ORAL
  Filled 2021-02-18 (×5): qty 1

## 2021-02-18 MED ORDER — MEPERIDINE HCL 25 MG/ML IJ SOLN: 6.2500 mg | INTRAMUSCULAR | Status: DC | PRN

## 2021-02-18 MED ORDER — ACETAMINOPHEN 500 MG PO TABS
1000.0000 mg | ORAL_TABLET | Freq: Four times a day (QID) | ORAL | Status: DC
  Administered 2021-02-18 – 2021-02-20 (×7): 1000 mg via ORAL
  Filled 2021-02-18 (×8): qty 2

## 2021-02-18 MED ORDER — KETOROLAC TROMETHAMINE 30 MG/ML IJ SOLN
30.0000 mg | Freq: Four times a day (QID) | INTRAMUSCULAR | Status: AC
  Administered 2021-02-18 – 2021-02-19 (×3): 30 mg via INTRAVENOUS
  Filled 2021-02-18 (×2): qty 1

## 2021-02-18 MED ORDER — OXYTOCIN-SODIUM CHLORIDE 30-0.9 UT/500ML-% IV SOLN
INTRAVENOUS | Status: DC | PRN
  Administered 2021-02-18: 30 [IU] via INTRAVENOUS

## 2021-02-18 MED ORDER — OXYCODONE HCL 5 MG/5ML PO SOLN: 5.0000 mg | Freq: Once | ORAL | Status: DC | PRN

## 2021-02-18 MED ORDER — SODIUM CHLORIDE 0.9% FLUSH: 3.0000 mL | INTRAVENOUS | Status: DC | PRN

## 2021-02-18 MED ORDER — NALBUPHINE HCL 10 MG/ML IJ SOLN: 5.0000 mg | INTRAMUSCULAR | Status: DC | PRN

## 2021-02-18 MED ORDER — FLEET ENEMA 7-19 GM/118ML RE ENEM: 1.0000 | ENEMA | Freq: Every day | RECTAL | Status: DC | PRN

## 2021-02-18 MED ORDER — SIMETHICONE 80 MG PO CHEW: 80.0000 mg | CHEWABLE_TABLET | ORAL | Status: DC | PRN

## 2021-02-18 MED ORDER — MENTHOL 3 MG MT LOZG: 1.0000 | LOZENGE | OROMUCOSAL | Status: DC | PRN

## 2021-02-18 MED ORDER — SCOPOLAMINE 1 MG/3DAYS TD PT72
1.0000 | MEDICATED_PATCH | Freq: Once | TRANSDERMAL | Status: DC
  Administered 2021-02-18: 1.5 mg via TRANSDERMAL

## 2021-02-18 MED ORDER — SODIUM CHLORIDE 0.9 % IV SOLN: INTRAVENOUS | Status: DC | PRN

## 2021-02-18 MED ORDER — PHENYLEPHRINE HCL-NACL 20-0.9 MG/250ML-% IV SOLN
INTRAVENOUS | Status: DC | PRN
  Administered 2021-02-18: 60 ug/min via INTRAVENOUS

## 2021-02-18 MED ORDER — DIPHENHYDRAMINE HCL 50 MG/ML IJ SOLN: 12.5000 mg | INTRAMUSCULAR | Status: DC | PRN

## 2021-02-18 MED ORDER — SOD CITRATE-CITRIC ACID 500-334 MG/5ML PO SOLN
ORAL | Status: AC
  Filled 2021-02-18: qty 30

## 2021-02-18 MED ORDER — FENTANYL CITRATE (PF) 100 MCG/2ML IJ SOLN
INTRAMUSCULAR | Status: DC | PRN
  Administered 2021-02-18: 15 ug via INTRATHECAL

## 2021-02-18 MED ORDER — MORPHINE SULFATE (PF) 0.5 MG/ML IJ SOLN
INTRAMUSCULAR | Status: AC
  Filled 2021-02-18: qty 10

## 2021-02-18 MED ORDER — ACETAMINOPHEN 500 MG PO TABS
ORAL_TABLET | ORAL | Status: AC
  Filled 2021-02-18: qty 2

## 2021-02-18 MED ORDER — PHENYLEPHRINE 40 MCG/ML (10ML) SYRINGE FOR IV PUSH (FOR BLOOD PRESSURE SUPPORT)
PREFILLED_SYRINGE | INTRAVENOUS | Status: AC
  Filled 2021-02-18: qty 10

## 2021-02-18 MED ORDER — ONDANSETRON HCL 4 MG/2ML IJ SOLN: 4.0000 mg | Freq: Three times a day (TID) | INTRAMUSCULAR | Status: DC | PRN

## 2021-02-18 MED ORDER — LACTATED RINGERS IV SOLN: INTRAVENOUS | Status: DC

## 2021-02-18 MED ORDER — WITCH HAZEL-GLYCERIN EX PADS: 1.0000 "application " | MEDICATED_PAD | CUTANEOUS | Status: DC | PRN

## 2021-02-18 MED ORDER — PHENYLEPHRINE HCL-NACL 20-0.9 MG/250ML-% IV SOLN
INTRAVENOUS | Status: AC
  Filled 2021-02-18: qty 250

## 2021-02-18 MED ORDER — COCONUT OIL OIL: 1.0000 "application " | TOPICAL_OIL | Status: DC | PRN

## 2021-02-18 MED ORDER — TETANUS-DIPHTH-ACELL PERTUSSIS 5-2.5-18.5 LF-MCG/0.5 IM SUSY: 0.5000 mL | PREFILLED_SYRINGE | Freq: Once | INTRAMUSCULAR | Status: DC

## 2021-02-18 MED ORDER — MORPHINE SULFATE (PF) 0.5 MG/ML IJ SOLN
INTRAMUSCULAR | Status: DC | PRN
  Administered 2021-02-18: .15 mg via INTRATHECAL

## 2021-02-18 MED ORDER — KETOROLAC TROMETHAMINE 30 MG/ML IJ SOLN
30.0000 mg | Freq: Four times a day (QID) | INTRAMUSCULAR | Status: AC | PRN
  Filled 2021-02-18: qty 1

## 2021-02-18 MED ORDER — FENTANYL CITRATE (PF) 100 MCG/2ML IJ SOLN
INTRAMUSCULAR | Status: AC
  Filled 2021-02-18: qty 2

## 2021-02-18 MED ORDER — SENNOSIDES-DOCUSATE SODIUM 8.6-50 MG PO TABS
2.0000 | ORAL_TABLET | ORAL | Status: DC
  Administered 2021-02-19: 2 via ORAL
  Filled 2021-02-18 (×2): qty 2

## 2021-02-18 MED ORDER — DEXAMETHASONE SODIUM PHOSPHATE 10 MG/ML IJ SOLN
INTRAMUSCULAR | Status: DC | PRN
Start: 2021-02-18 — End: 2021-02-18
  Administered 2021-02-18: 4 mg via INTRAVENOUS

## 2021-02-18 MED ORDER — ACETAMINOPHEN 500 MG PO TABS
1000.0000 mg | ORAL_TABLET | Freq: Four times a day (QID) | ORAL | Status: DC
  Administered 2021-02-18: 1000 mg via ORAL

## 2021-02-18 MED ORDER — BUPIVACAINE IN DEXTROSE 0.75-8.25 % IT SOLN
INTRATHECAL | Status: DC | PRN
Start: 2021-02-18 — End: 2021-02-18
  Administered 2021-02-18: 1.7 mL via INTRATHECAL

## 2021-02-18 MED ORDER — PHENYLEPHRINE 40 MCG/ML (10ML) SYRINGE FOR IV PUSH (FOR BLOOD PRESSURE SUPPORT)
PREFILLED_SYRINGE | INTRAVENOUS | Status: DC | PRN
  Administered 2021-02-18: 120 ug via INTRAVENOUS

## 2021-02-18 MED ORDER — KETOROLAC TROMETHAMINE 30 MG/ML IJ SOLN: 30.0000 mg | Freq: Once | INTRAMUSCULAR | Status: DC | PRN

## 2021-02-18 MED ORDER — OXYCODONE HCL 5 MG PO TABS
5.0000 mg | ORAL_TABLET | ORAL | Status: DC | PRN
  Administered 2021-02-19: 5 mg via ORAL
  Filled 2021-02-18: qty 1

## 2021-02-18 MED ORDER — KETOROLAC TROMETHAMINE 30 MG/ML IJ SOLN: 30.0000 mg | Freq: Four times a day (QID) | INTRAMUSCULAR | Status: AC | PRN

## 2021-02-18 MED ORDER — PROMETHAZINE HCL 25 MG/ML IJ SOLN: 6.2500 mg | INTRAMUSCULAR | Status: DC | PRN

## 2021-02-18 MED ORDER — EPHEDRINE 5 MG/ML INJ
INTRAVENOUS | Status: AC
  Filled 2021-02-18: qty 5

## 2021-02-18 MED ORDER — HYDROMORPHONE HCL 1 MG/ML IJ SOLN: 0.2500 mg | INTRAMUSCULAR | Status: DC | PRN

## 2021-02-18 MED ORDER — HYDROMORPHONE HCL 1 MG/ML IJ SOLN
0.2000 mg | INTRAMUSCULAR | Status: DC | PRN
Start: 2021-02-18 — End: 2021-02-20
  Administered 2021-02-18: 0.2 mg via INTRAVENOUS
  Filled 2021-02-18: qty 1

## 2021-02-18 MED ORDER — FENTANYL CITRATE (PF) 100 MCG/2ML IJ SOLN: INTRAMUSCULAR | Status: DC | PRN

## 2021-02-18 SURGICAL SUPPLY — 29 items
BENZOIN TINCTURE PRP APPL 2/3 (GAUZE/BANDAGES/DRESSINGS) ×2 IMPLANT
CLAMP CORD UMBIL (MISCELLANEOUS) ×2 IMPLANT
CLIP FILSHIE TUBAL LIGA STRL (Clip) ×1 IMPLANT
CLOSURE STERI STRIP 1/2 X4 (GAUZE/BANDAGES/DRESSINGS) ×2 IMPLANT
CLOTH BEACON ORANGE TIMEOUT ST (SAFETY) ×2 IMPLANT
DRSG OPSITE POSTOP 4X10 (GAUZE/BANDAGES/DRESSINGS) ×2 IMPLANT
ELECT REM PT RETURN 9FT ADLT (ELECTROSURGICAL) ×2
ELECTRODE REM PT RTRN 9FT ADLT (ELECTROSURGICAL) ×1 IMPLANT
EXTRACTOR VACUUM BELL STYLE (SUCTIONS) ×2 IMPLANT
GLOVE SURG LTX SZ6 (GLOVE) ×2 IMPLANT
GLOVE SURG UNDER POLY LF SZ6.5 (GLOVE) ×2 IMPLANT
GOWN STRL REUS W/TWL LRG LVL3 (GOWN DISPOSABLE) ×4 IMPLANT
KIT ABG SYR 3ML LUER SLIP (SYRINGE) ×2 IMPLANT
NEEDLE HYPO 25X5/8 SAFETYGLIDE (NEEDLE) ×2 IMPLANT
NS IRRIG 1000ML POUR BTL (IV SOLUTION) ×2 IMPLANT
PACK C SECTION WH (CUSTOM PROCEDURE TRAY) ×2 IMPLANT
PAD OB MATERNITY 4.3X12.25 (PERSONAL CARE ITEMS) ×2 IMPLANT
PENCIL SMOKE EVAC W/HOLSTER (ELECTROSURGICAL) ×2 IMPLANT
RTRCTR C-SECT PINK 25CM LRG (MISCELLANEOUS) ×2 IMPLANT
STRIP CLOSURE SKIN 1/2X4 (GAUZE/BANDAGES/DRESSINGS) ×2 IMPLANT
SUT MNCRL 0 VIOLET CTX 36 (SUTURE) ×2 IMPLANT
SUT MONOCRYL 0 CTX 36 (SUTURE) ×2
SUT VIC AB 0 CT1 36 (SUTURE) ×4 IMPLANT
SUT VIC AB 3-0 CT1 27 (SUTURE) ×1
SUT VIC AB 3-0 CT1 TAPERPNT 27 (SUTURE) ×1 IMPLANT
SUT VIC AB 4-0 KS 27 (SUTURE) ×2 IMPLANT
TOWEL OR 17X24 6PK STRL BLUE (TOWEL DISPOSABLE) ×2 IMPLANT
TRAY FOLEY W/BAG SLVR 14FR LF (SET/KITS/TRAYS/PACK) ×2 IMPLANT
WATER STERILE IRR 1000ML POUR (IV SOLUTION) ×2 IMPLANT

## 2021-02-18 NOTE — Transfer of Care (Signed)
Immediate Anesthesia Transfer of Care Note  Patient: Kimberly Padilla  Procedure(s) Performed: CESAREAN SECTION  Patient Location: PACU  Anesthesia Type:Spinal  Level of Consciousness: awake, alert  and oriented  Airway & Oxygen Therapy: Patient Spontanous Breathing  Post-op Assessment: Report given to RN and Post -op Vital signs reviewed and stable  Post vital signs: Reviewed and stable  Last Vitals:  Vitals Value Taken Time  BP 115/59 02/18/21 0845  Temp    Pulse 73 02/18/21 0848  Resp 12 02/18/21 0848  SpO2 100 % 02/18/21 0848  Vitals shown include unvalidated device data.  Last Pain:  Vitals:   02/18/21 0615  TempSrc: Oral  PainSc: 0-No pain         Complications: No notable events documented.

## 2021-02-18 NOTE — Lactation Note (Signed)
This note was copied from a baby's chart. Lactation Consultation Note  Patient Name: Kimberly Padilla [id number] Date: 02/18/2021 Reason for consult: Initial assessment Age:27 hours  P1, Baby sleeping of FOB's chest for temperature regulation. Reviewed hand expression with mother and discussed basics. Feed on demand with cues.  Goal 8-12+ times per day after first 24 hrs.   Mom made aware of O/P services, breastfeeding support groups,  and our phone # for post-discharge questions.    Maternal Data Has patient been taught Hand Expression?: Yes Does the patient have breastfeeding experience prior to this delivery?: No  Feeding Mother's Current Feeding Choice: Breast Milk  LATCH Score Latch: Grasps breast easily, tongue down, lips flanged, rhythmical sucking.  Audible Swallowing: A few with stimulation  Type of Nipple: Everted at rest and after stimulation  Comfort (Breast/Nipple): Soft / non-tender  Hold (Positioning): Assistance needed to correctly position infant at breast and maintain latch.  LATCH Score: 8      Interventions Interventions: Hand express;Breast feeding basics reviewed;Education   Consult Status Consult Status: Follow-up Date: 02/18/21 Follow-up type: In-patient    Vivianne Master [facility] 02/18/2021, 11:51 AM

## 2021-02-18 NOTE — Progress Notes (Signed)
Dr. Brien Mates at bedside consenting patient. Pt. Educated on risks and benefits of surgery. Consent signed.

## 2021-02-18 NOTE — Anesthesia Procedure Notes (Signed)
Spinal  Patient location during procedure: OR Start time: 02/18/2021 7:37 AM End time: 02/18/2021 7:43 AM Reason for block: surgical anesthesia Staffing Performed: anesthesiologist  Anesthesiologist: Pervis Hocking, DO Preanesthetic Checklist Completed: patient identified, IV checked, risks and benefits discussed, surgical consent, monitors and equipment checked, pre-op evaluation and timeout performed Spinal Block Patient position: sitting Prep: DuraPrep and site prepped and draped Patient monitoring: cardiac monitor, continuous pulse ox and blood pressure Approach: midline Location: L3-4 Injection technique: single-shot Needle Needle type: Pencan  Needle gauge: 24 G Needle length: 9 cm Assessment Sensory level: T6 Events: CSF return Additional Notes Functioning IV was confirmed and monitors were applied. Sterile prep and drape, including hand hygiene and sterile gloves were used. The patient was positioned and the spine was prepped. The skin was anesthetized with lidocaine.  Free flow of clear CSF was obtained prior to injecting local anesthetic into the CSF.  The spinal needle aspirated freely following injection.  The needle was carefully withdrawn.  The patient tolerated the procedure well.

## 2021-02-18 NOTE — Anesthesia Postprocedure Evaluation (Signed)
Anesthesia Post Note  Patient: Kimberly Padilla  Procedure(s) Performed: CESAREAN SECTION     Patient location during evaluation: PACU Anesthesia Type: Spinal Level of consciousness: oriented and awake and alert Pain management: pain level controlled Vital Signs Assessment: post-procedure vital signs reviewed and stable Respiratory status: spontaneous breathing and respiratory function stable Cardiovascular status: blood pressure returned to baseline and stable Postop Assessment: no headache, no backache, no apparent nausea or vomiting, patient able to bend at knees and spinal receding Anesthetic complications: no   No notable events documented.  Last Vitals:  Vitals:   02/18/21 0945 02/18/21 1008  BP: 112/72 113/68  Pulse: 67 (!) 55  Resp: 19 18  Temp:  36.6 C  SpO2: 100% 100%    Last Pain:  Vitals:   02/18/21 1008  TempSrc: Axillary  PainSc:    Pain Goal:    LLE Motor Response: Purposeful movement (02/18/21 0945)   RLE Motor Response: Purposeful movement (02/18/21 0945)       Epidural/Spinal Function Cutaneous sensation: Tingles (02/18/21 0945), Patient able to flex knees: Yes (02/18/21 0945), Patient able to lift hips off bed: Yes (02/18/21 0945), Back pain beyond tenderness at insertion site: No (02/18/21 0945), Progressively worsening motor and/or sensory loss: No (02/18/21 0945), Bowel and/or bladder incontinence post epidural: No (02/18/21 0945)  Pervis Hocking

## 2021-02-18 NOTE — Op Note (Signed)
CESAREAN SECTION Procedure Note  Patient: Kimberly Padilla is a 25 y.o. G2P1011 @ [redacted]w[redacted]d Preoperative Diagnosis:  Intrauterine pregnancy at 39 weeks 5 days Breech presentation 7.9 cm left fundal fibroid  Postoperative Diagnosis: same, delivered  Procedure: Primarily low transverse cesarean     Surgeon: MRowland Lathe, MD  Assistant: SAllyn Kenner DO  Anesthesia: Spinal anesthesia   Findings: 8cm left posterior fundal fibroid. Normal appearing fallopian tubes bilaterally, and ovaries bilaterally.  Viable female infant in frank breech presentation delivered at 0803 with weight pending, Apgars 9 and 9.  Estimated Blood Loss:  2723m        Specimens: Placenta to L&D for disposal         Complications:  None         Disposition: PACU - hemodynamically stable.         Condition: stable    Description of Procedure: The patient was taken to the operating room where spinal anesthesia was placed and found to be adequate.  The patient was placed in the dorsal supine position.  Fetal heart tones were confirmed. Thromboguards were applied and cycling. A foley catheter was inserted and draining. Ancef 3g was given for infection prophylaxis. The patient was subsequently prepped and draped in the normal sterile fashion.    A low transverse skin incision was made with a scalpel and carried down to the level of the fascia with the Bovie.  The fascia was incised in the midline with the scalpel and extended laterally with curved Mayo scissors.  Kocher clamps were applied to the inferior fascial edge and the fascia was dissected off the rectus muscle sharply using the Mayo scissors.  The Kocher clamps were transferred to the superior fascial edge and the underlying rectus muscle was dissected off with curved Mayo's scissors.  The rectus muscles then were separated in the midline.  The peritoneum was found free of adherent bowel and the peritoneal cavity was entered with Metzenbaum scissors.  The  uterus was identified and the alexis retractor was placed intraperitoneal.  The bladder was noted to be low so no bladder flap was required.   A low transverse hysterotomy was then made with a scalpel. The amniotic sac was ruptured for clear fluid. The infant was found in the frank breech presentation and was delivered atraumatically and without difficulty with standard breech maneuvers. A tight nuchal cord times two was reduced.  After 60 seconds of delayed cord clamping the cord was clamped and cut and the infant was handed off to the pediatricians.  The placenta was delivered with gentle traction on umbilical cord and manual massage of the uterine fundus.  The uterus was cleared of all clot and debris.  The hysterotomy was then closed with 0 monocryl in a running locked fashion,  followed by 0 Monocryl in an imbricating fashion.  The hysterotomy was found to be hemostatic. The fascia was closed with a 0 Vicryl suture in a continuous running fashion.  The subcutaneous tissue was irrigated and rendered hemostatic with cautery.  The subcutaneous layer was subsequently closed with 3-0 Vicryl in a continuous running fashion.  The skin was closed with 4-0 vicryl  in a running subcuticular fashion.  Sponge, lap and needle counts were correct. Steri strips and a Honeycomb dressing were placed on the incision and a pressure dressing was applied  MiRowland Lathe8/16/22 8:35 AM

## 2021-02-18 NOTE — Progress Notes (Signed)
Dr. Elgie Congo at bedside consenting patient for anesthesia. Risks and benefits explained and consents signed.

## 2021-02-19 LAB — CBC
HCT: 30.7 % — ABNORMAL LOW (ref 36.0–46.0)
Hemoglobin: 9.8 g/dL — ABNORMAL LOW (ref 12.0–15.0)
MCH: 27.3 pg (ref 26.0–34.0)
MCHC: 31.9 g/dL (ref 30.0–36.0)
MCV: 85.5 fL (ref 80.0–100.0)
Platelets: 189 10*3/uL (ref 150–400)
RBC: 3.59 MIL/uL — ABNORMAL LOW (ref 3.87–5.11)
RDW: 14 % (ref 11.5–15.5)
WBC: 13.5 10*3/uL — ABNORMAL HIGH (ref 4.0–10.5)
nRBC: 0 % (ref 0.0–0.2)

## 2021-02-19 LAB — BIRTH TISSUE RECOVERY COLLECTION (PLACENTA DONATION)

## 2021-02-19 NOTE — Lactation Note (Addendum)
This note was copied from a baby's chart. Lactation Consultation Note  Patient Name: Kimberly Padilla [id number] Date: 02/19/2021 Reason for consult: Follow-up assessment;Mother's request;Early term 37-38.6wks Age:27 hours  LC not able to see a latch, infant fed 2 hrs prior with formula. Mom states infant latching well worried about her milk supply as she does not hear audible swallows during feeding. Mom to call for latch assistance of RN or LC for next feeding.   Mom encouraged to pump regularly DEBP q 3 hrs for 15 min. LC reviewed supplementation volume after latching, Mom aware to offer more if infant not latching at the breast.   Plan 1. To feed based on cues 8-12x in 24 hr period. Mom to offer infant breast first with compression and listen for swallows.  2. Mom to pace bottle feed with slow flow nipple with EBM first followed by formula.  Breastfeeding supplementation guide provided.  3. Mom to pump with DEBP as stated above.   Mom denied any pain with latching or with current use of 24 flange for pumping.  All questions answered at the end of the visit.   Maternal Data Has patient been taught Hand Expression?: Yes  Feeding Mother's Current Feeding Choice: Breast Milk and Formula  LATCH Score                    Lactation Tools Discussed/Used Tools: Pump;Flanges Flange Size: 24 Breast pump type: Double-Electric Breast Pump Pump Education: Setup, frequency, and cleaning;Milk Storage Reason for Pumping: increase stimulation Pumping frequency: every 3 hhrs for 58mn  Interventions Interventions: Breast feeding basics reviewed;Breast compression;Skin to skin;Support pillows;DEBP;Breast massage;Position options;Hand express;Expressed milk;Education;Pre-pump if needed;Pace feeding  Discharge    Consult Status Consult Status: Follow-up Date: 02/20/21 Follow-up type: In-patient    Kimberly Stelle  Padilla 02/19/2021, 10:25 PM

## 2021-02-19 NOTE — Progress Notes (Signed)
Patient is doing well.  She is tolerating PO, ambulating, voiding.  Pain is controlled.  Lochia is appropriate  Vitals:   02/18/21 2230 02/19/21 0030 02/19/21 0243 02/19/21 0432  BP:  111/61  108/62  Pulse:  64  63  Resp: '18 16 18 18  '$ Temp:  98.2 F (36.8 C)  98.1 F (36.7 C)  TempSrc:  Oral    SpO2: 100% 100% 100% 97%  Weight:      Height:        NAD Abdomen:  soft, appropriate tenderness, incisions intact and without erythema ext:    Symmetric, trace edema bilaterally  Lab Results  Component Value Date   WBC 13.5 (H) 02/19/2021   HGB 9.8 (L) 02/19/2021   HCT 30.7 (L) 02/19/2021   MCV 85.5 02/19/2021   PLT 189 02/19/2021    --/--/A POS (08/15 1038)/RImmune  A/P    25 y.o. G2P1011 POD 1 s/p primary cesarean section for breech presentation Routine post op and postpartum care.   Anticipate discharge tomorrow

## 2021-02-20 MED ORDER — OXYCODONE-ACETAMINOPHEN 5-325 MG PO TABS: 1.0000 | ORAL_TABLET | ORAL | 0 refills | Status: AC | PRN

## 2021-02-20 MED ORDER — ACETAMINOPHEN 325 MG PO TABS: 650.0000 mg | ORAL_TABLET | Freq: Four times a day (QID) | ORAL | Status: AC | PRN

## 2021-02-20 MED ORDER — IBUPROFEN 200 MG PO TABS: 600.0000 mg | ORAL_TABLET | Freq: Four times a day (QID) | ORAL | Status: AC | PRN

## 2021-02-20 NOTE — Progress Notes (Signed)
  Patient is eating, ambulating, voiding.  Pain control is good.  Vitals:   02/19/21 0243 02/19/21 0432 02/19/21 1400 02/19/21 2130  BP:  108/62 126/76 (!) 114/57  Pulse:  63 63 70  Resp: '18 18 16 18  '$ Temp:  98.1 F (36.7 C) 98.6 F (37 C) 98.4 F (36.9 C)  TempSrc:   Oral Oral  SpO2: 100% 97%  100%  Weight:      Height:        lungs:   clear to auscultation cor:    RRR Abdomen:  soft, appropriate tenderness, incisions intact and without erythema or exudate ex:    no cords   Lab Results  Component Value Date   WBC 13.5 (H) 02/19/2021   HGB 9.8 (L) 02/19/2021   HCT 30.7 (L) 02/19/2021   MCV 85.5 02/19/2021   PLT 189 02/19/2021    --/--/A POS (08/15 1038)/RI  A/P    Post operative day 2.  Routine post op and postpartum care.  Expect d/c today.  Percocet for pain control.

## 2021-02-20 NOTE — Discharge Summary (Signed)
Postpartum Discharge Summary  Date of Service updated      Patient Name: Kimberly Padilla DOB: Jun 24, 1996 MRN: [id number]  Date of admission: 02/18/2021 Delivery date:   Kimberly Padilla, Kimberly Padilla [[id number]]      Kimberly Padilla, Kimberly Padilla [[id number]]  02/18/2021  Delivering provider:    Aleenah, Homen [[id number]]      [name], [name]  Kimberly Padilla  Date of discharge: 02/20/2021  Admitting diagnosis: Malpresentation of fetus [O32.9XX0] Intrauterine pregnancy: [redacted]w[redacted]d     Secondary diagnosis:  Active Problems:   Malpresentation of fetus  Additional problems: none    Discharge diagnosis: Term Pregnancy Delivered                                              Post partum procedures: none Augmentation: N/A Complications: None  Hospital course: Sceduled C/S   25 y.o. yo G2P1011 at [redacted]w[redacted]d was admitted to the hospital 02/18/2021 for scheduled cesarean section with the following indication:Malpresentation.Delivery details are as follows:  Membrane Rupture Time/Date:    Kimberly Padilla, Kimberly Padilla [[id number]]      Kimberly Padilla, Kimberly Padilla [[id number]]  8:02 AM ,   Kimberly Padilla, Kimberly Padilla [[id number]]      Kimberly Padilla, Kimberly Padilla [[id number]]  02/18/2021   Delivery Method:   Kimberly Padilla, Kimberly Padilla [[id number]]      Kimberly Padilla, Kimberly Padilla [[id number]]  C-Section, Low Transverse  Details of operation can be found in separate operative note.  Patient had an uncomplicated postpartum course.  She is ambulating, tolerating a regular diet, passing flatus, and urinating well. Patient is discharged home in stable condition on  02/20/21        Newborn Data: Birth date:   Kimberly Padilla, Kimberly Padilla [[id number]]      Kimberly Padilla, Kimberly Padilla [[id number]]  02/18/2021  Birth time:   Kimberly Padilla, Kimberly Padilla [[id number]]      Kimberly Padilla, Kimberly Padilla [[id number]]  8:03 AM  Gender:   Kimberly Padilla, [name] [[id number]]      [name], [name] [[id number]]  Female  Living status:   Evaleigh, [name]       [name], [name]  Living  Apgars:   [name], [name]      [name], [name]  0 ,   [name], [name] [[id number]]      [name], [name] [[id number]]  9  Weight:   [name], [name]      [name], [name]  3290 g     Magnesium Sulfate received: No BMZ received: No Rhophylac:No MMR:No T-DaP:Given prenatally Flu: No Transfusion:No  Physical exam  Vitals:   02/19/21 0243 02/19/21 0432 02/19/21 1400 02/19/21 2130  BP:  108/62 126/76 (!) 114/57  Pulse:  63 63 70  Resp: $Remo'18 18 16 18  'ESauy$ Temp:  98.1 F (36.7 C) 98.6 F (37 C) 98.4 F (36.9 C)  TempSrc:   Oral Oral  SpO2: 100% 97%  100%  Weight:      Height:        Labs: Lab Results  Component Value Date   WBC 13.5 (H) 02/19/2021   HGB 9.8 (L) 02/19/2021   HCT 30.7 (L) 02/19/2021   MCV 85.5 02/19/2021   PLT 189 02/19/2021   CMP Latest Ref Rng & Units 01/08/2021  Glucose 70 - 99 mg/dL 109(H)  BUN 6 - 20 mg/dL 6  Creatinine 0.44 - 1.00 mg/dL 0.57  Sodium 135 - 145 mmol/L  135  Potassium 3.5 - 5.1 mmol/L 3.8  Chloride 98 - 111 mmol/L 107  CO2 22 - 32 mmol/L 20(L)  Calcium 8.9 - 10.3 mg/dL 9.0  Total Protein 6.5 - 8.1 g/dL -  Total Bilirubin 0.3 - 1.2 mg/dL -  Alkaline Phos 38 - 126 U/L -  AST 15 - 41 U/L -  ALT 0 - 44 U/L -   Edinburgh Score: Edinburgh Postnatal Depression Scale Screening Tool 02/18/2021  I have been able to laugh and see the funny side of things. 0  I have looked forward with enjoyment to things. 0  I have blamed myself unnecessarily when things went wrong. 0  I have been anxious or worried for no good reason. 0  I have felt scared or panicky for no good reason. 0  Things have been getting on top of me. 0  I have been so unhappy that I have had difficulty sleeping. 0  I have felt sad or miserable. 0  I have been so unhappy that I have been crying. 0  The thought of harming myself has occurred to me. 0  Edinburgh  Postnatal Depression Scale Total 0      After visit meds:  Allergies as of 02/20/2021       Reactions   Shellfish Allergy Anaphylaxis, Hives   Other    Nuts- reaction varies Mango, berries, pineapple, kiwi- mouth itch, anaphlaxis reaction ALL fruit and fish        Medication List     STOP taking these medications    aspirin EC 81 MG tablet       TAKE these medications    calcium carbonate 500 MG chewable tablet Commonly known as: TUMS - dosed in mg elemental calcium Chew 2 tablets by mouth 2 (two) times daily as needed for indigestion or heartburn.   meclizine 12.5 MG tablet Commonly known as: ANTIVERT Take 12.5 mg by mouth 2 (two) times daily as needed (vertigo).   oxyCODONE-acetaminophen 5-325 MG tablet Commonly known as: PERCOCET/ROXICET Take 1 tablet by mouth every 4 (four) hours as needed for severe pain.   PRENATAL VITAMINS PO Take 1 tablet by mouth in the morning.   Vitamin D3 50 MCG (2000 UT) Tabs Take 2,000 Units by mouth in the morning.         Discharge home in stable condition Infant Feeding:  ? Infant Disposition:home with mother Discharge instruction: per After Visit Summary and Postpartum booklet. Activity: Advance as tolerated. Pelvic rest for 6 weeks.  Diet: routine diet Anticipated Birth Control: Unsure Postpartum Appointment:4 weeks Additional Postpartum F/U:  none Future Appointments:No future appointments. Follow up Visit:  Follow-up Information     Rowland Lathe, MD Follow up in 4 week(s).   Specialty: Obstetrics and Gynecology Contact information: [address] [place] [address] [phone]                     02/20/2021 Daria Pastures, MD

## 2021-03-01 ENCOUNTER — Telehealth (HOSPITAL_COMMUNITY): Payer: Self-pay

## 2021-03-01 NOTE — Telephone Encounter (Signed)
"  I'm doing pretty good. I have an appointment in 2 weeks with my doctor." Patient declines questions or concerns about her healing.  "She's eating and gaining weight weight. She sleeps in a crib or her bassinet." RN reviewed ABC's of safe sleep with patient. Patient declines any questions or concerns about baby.  EPDS score is 0.   Sharyn Lull [facility] 03/01/2021,1454

## 2021-06-15 ENCOUNTER — Other Ambulatory Visit: Payer: Self-pay

## 2021-06-15 ENCOUNTER — Encounter (HOSPITAL_COMMUNITY): Payer: Self-pay | Admitting: Emergency Medicine

## 2021-06-15 ENCOUNTER — Emergency Department (HOSPITAL_COMMUNITY)
Admission: EM | Admit: 2021-06-15 | Discharge: 2021-06-16 | Discharge status: Against Medical Advice | Payer: Medicaid Other | Attending: Physician Assistant | Admitting: Physician Assistant

## 2021-06-15 DIAGNOSIS — R519 Headache, unspecified: Secondary | ICD-10-CM | POA: Insufficient documentation

## 2021-06-15 DIAGNOSIS — Z5321 Procedure and treatment not carried out due to patient leaving prior to being seen by health care provider: Secondary | ICD-10-CM | POA: Insufficient documentation

## 2021-06-15 DIAGNOSIS — R1030 Lower abdominal pain, unspecified: Secondary | ICD-10-CM | POA: Diagnosis not present

## 2021-06-15 DIAGNOSIS — R110 Nausea: Secondary | ICD-10-CM | POA: Diagnosis not present

## 2021-06-15 NOTE — ED Triage Notes (Signed)
Patient reports intermittent hypogastric pain with nausea and headache for several weeks . No fever or diarrhea .

## 2021-06-15 NOTE — ED Provider Notes (Signed)
Emergency Medicine Provider Triage Evaluation Note  Kimberly Padilla , a 25 y.o. female  was evaluated in triage.  Pt complains of lower abdominal pain.  She reports that today she is having nausea and headache also. She states that the primary reason she is here today is because she wants to know exactly why she is hurting.  She had a C-section about 4 months ago and states that she had difficulty healing the incision which finally healed about 2 weeks ago however she continues to have deep pain.  She states that while her skin is sensitive she also has much of deeper pain.  She denies any urinary symptoms.  She states that she has seen her OB/GYN for this, they have not done an ultrasound.  She has not taken any ibuprofen or tylenol.  She states she isn't here to get her pain treated, she is here because she wants to know why she is hurting.   Review of Systems  Positive: See above Negative: See above  Physical Exam  BP (!) 135/94 (BP Location: Right Arm)   Pulse 97   Temp 98.6 F (37 C) (Oral)   Resp 18   Ht 5\' 7"  (1.702 m)   Wt 135 kg   LMP 05/27/2021   SpO2 100%   BMI 46.61 kg/m  Gen:   Awake, no distress   Resp:  Normal effort  MSK:   Moves extremities without difficulty  Other:  Normal speech  Medical Decision Making  Medically screening exam initiated at 10:57 PM.  Appropriate orders placed.  Kimberly Padilla was informed that the remainder of the evaluation will be completed by another provider, this initial triage assessment does not replace that evaluation, and the importance of remaining in the ED until their evaluation is complete.  Patient presents today for evaluation of ongoing lower abdominal/pelvic pain since her C-section on 02/18/2021.  She states that it finally healed 2 weeks ago however she is still having pain and wants to know why.  She states she has seen her OB/GYN for this and even had a cervical biopsy taken two weeks ago.   Will order lab work, urine.    Kimberly Padilla 06/15/21 2301    Godfrey Pick, MD 06/16/21 [phone]

## 2021-06-16 LAB — I-STAT BETA HCG BLOOD, ED (MC, WL, AP ONLY): I-stat hCG, quantitative: <5 m[IU]/mL (ref <5)

## 2021-06-16 LAB — CBC WITH DIFFERENTIAL/PLATELET
Abs Immature Granulocytes: 0.03 10*3/uL (ref 0.00–0.07)
Basophils Absolute: 0 10*3/uL (ref 0.0–0.1)
Basophils Relative: 0 %
Eosinophils Absolute: 0.1 10*3/uL (ref 0.0–0.5)
Eosinophils Relative: 1 %
HCT: 41.4 % (ref 36.0–46.0)
Hemoglobin: 13.5 g/dL (ref 12.0–15.0)
Immature Granulocytes: 0 %
Lymphocytes Relative: 21 %
Lymphs Abs: 1.8 10*3/uL (ref 0.7–4.0)
MCH: 27.8 pg (ref 26.0–34.0)
MCHC: 32.6 g/dL (ref 30.0–36.0)
MCV: 85.2 fL (ref 80.0–100.0)
Monocytes Absolute: 0.6 10*3/uL (ref 0.1–1.0)
Monocytes Relative: 7 %
Neutro Abs: 6.3 10*3/uL (ref 1.7–7.7)
Neutrophils Relative %: 71 %
Platelets: 301 10*3/uL (ref 150–400)
RBC: 4.86 MIL/uL (ref 3.87–5.11)
RDW: 12.4 % (ref 11.5–15.5)
WBC: 8.9 10*3/uL (ref 4.0–10.5)
nRBC: 0 % (ref 0.0–0.2)

## 2021-06-16 LAB — COMPREHENSIVE METABOLIC PANEL
ALT: 16 U/L (ref 0–44)
AST: 17 U/L (ref 15–41)
Albumin: 3.7 g/dL (ref 3.5–5.0)
Alkaline Phosphatase: 66 U/L (ref 38–126)
Anion gap: 8 (ref 5–15)
BUN: 13 mg/dL (ref 6–20)
CO2: 24 mmol/L (ref 22–32)
Calcium: 9.2 mg/dL (ref 8.9–10.3)
Chloride: 104 mmol/L (ref 98–111)
Creatinine, Ser: 0.72 mg/dL (ref 0.44–1.00)
GFR, Estimated: >60 mL/min (ref >60)
Glucose, Bld: 96 mg/dL (ref 70–99)
Potassium: 4.3 mmol/L (ref 3.5–5.1)
Sodium: 136 mmol/L (ref 135–145)
Total Bilirubin: 0.3 mg/dL (ref 0.3–1.2)
Total Protein: 7.1 g/dL (ref 6.5–8.1)

## 2021-06-16 LAB — LIPASE, BLOOD: Lipase: 35 U/L (ref 11–51)

## 2021-06-16 NOTE — ED Notes (Signed)
Pt stated she is just going to check in with her PCP in the AM

## 2021-06-19 ENCOUNTER — Ambulatory Visit: Payer: Medicaid Other | Admitting: Family

## 2021-08-01 IMAGING — US US MFM OB DETAIL+14 WK
1 series · 12 of 28 positions shown · non-contrast
Comparison: none

[Series 1: us mfm ob detail+14 wk · 12 of 89 slices shown]
[im 4/89]
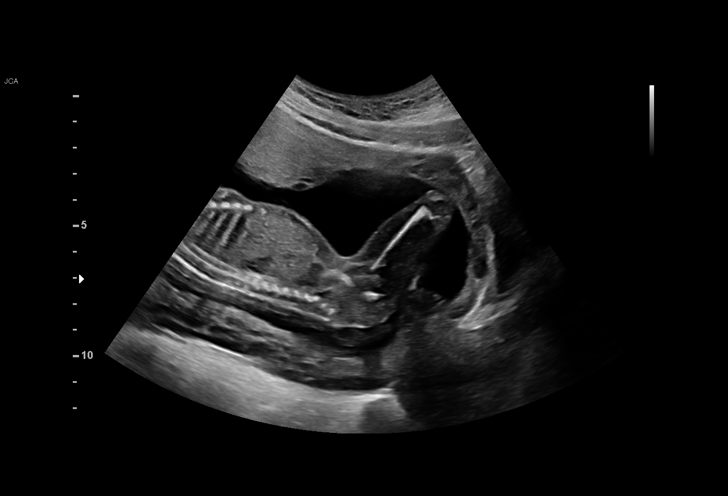
[im 10/89]
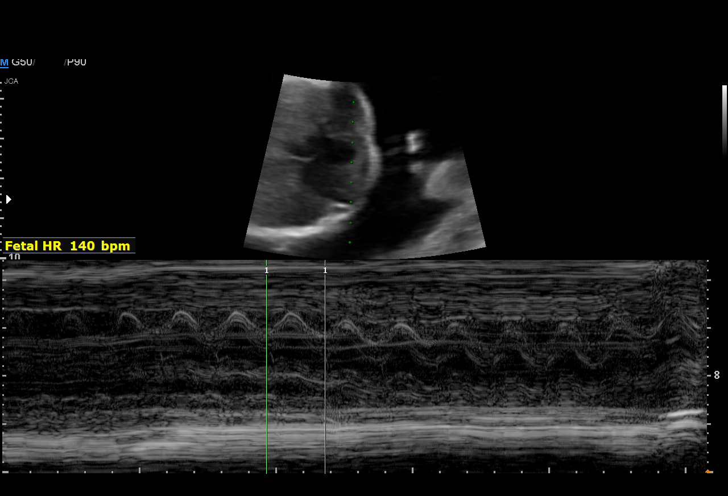
[im 17/89]
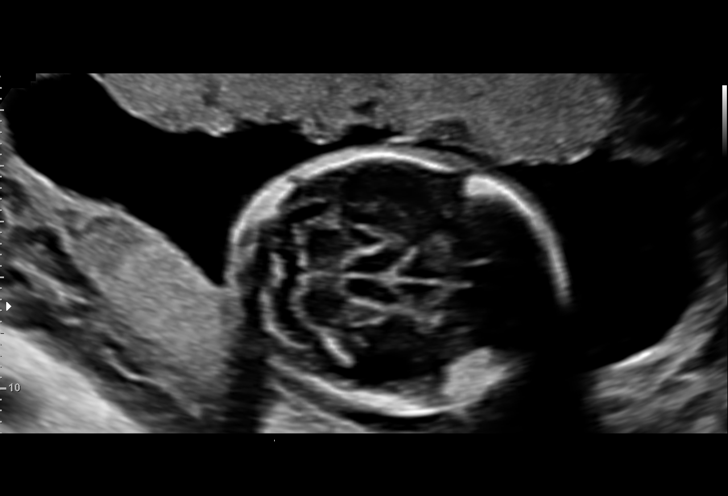
[im 27/89]
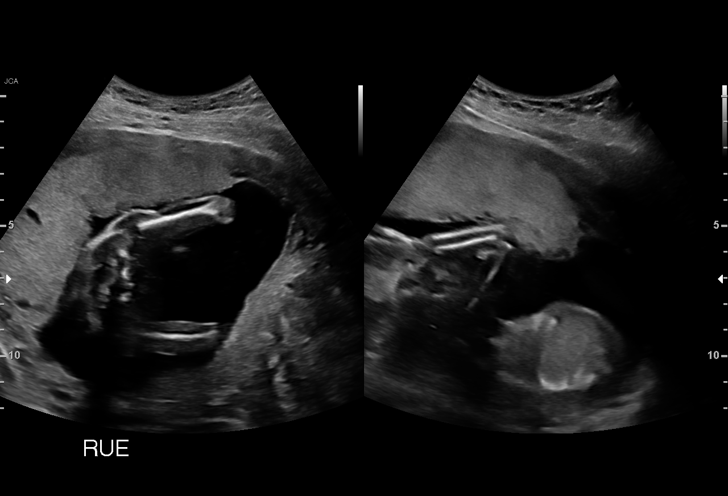
[im 33/89]
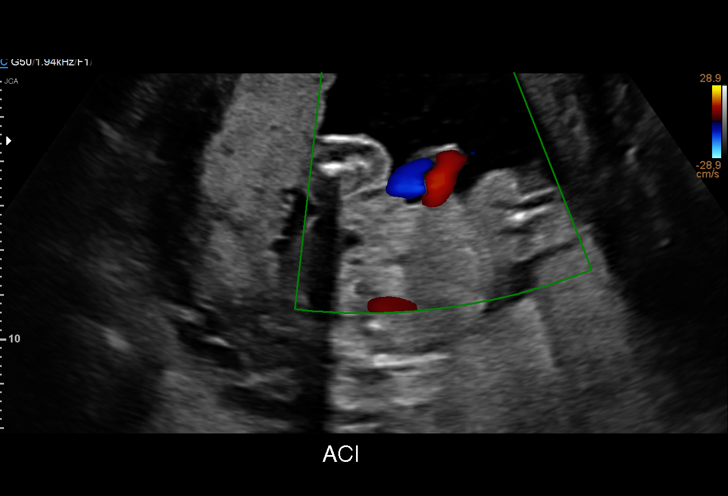
[im 40/89]
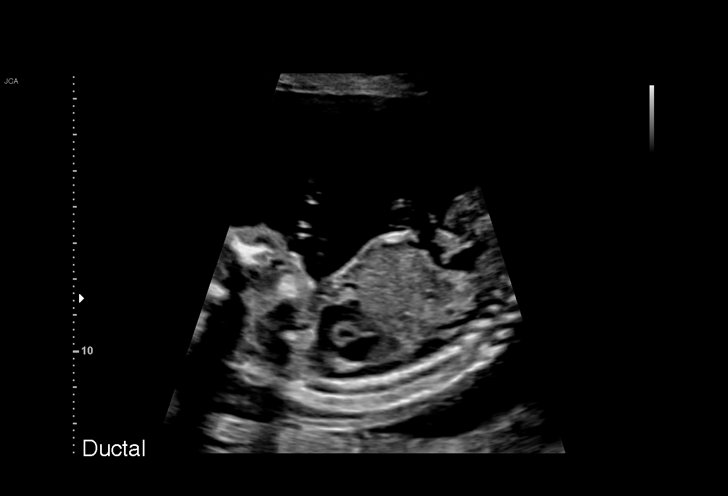
[im 49/89]
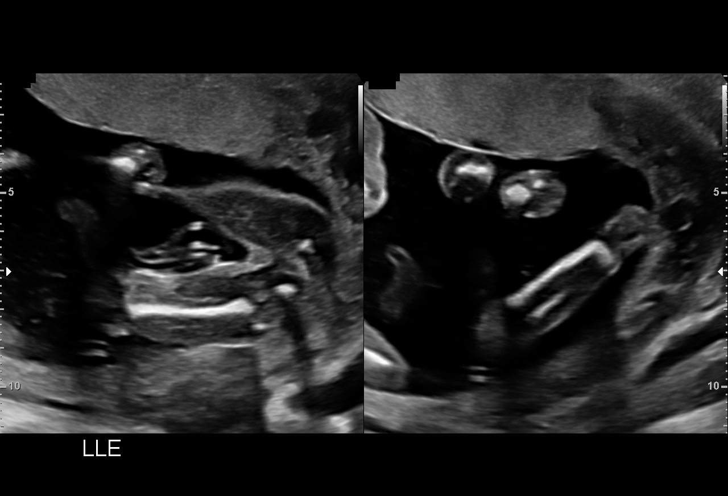
[im 56/89]
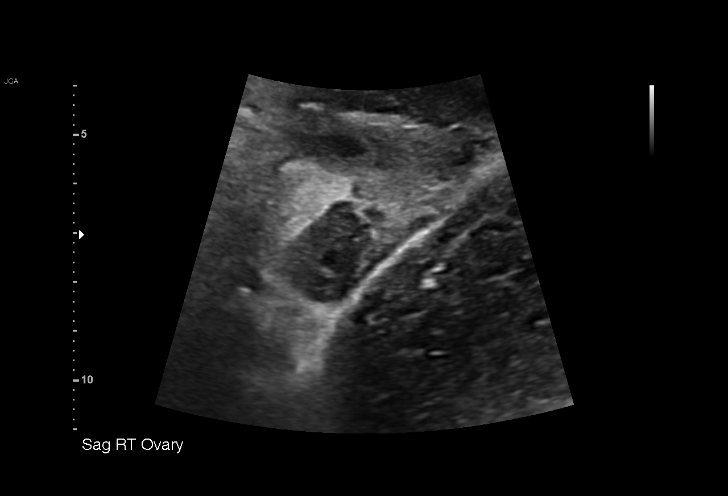
[im 62/89]
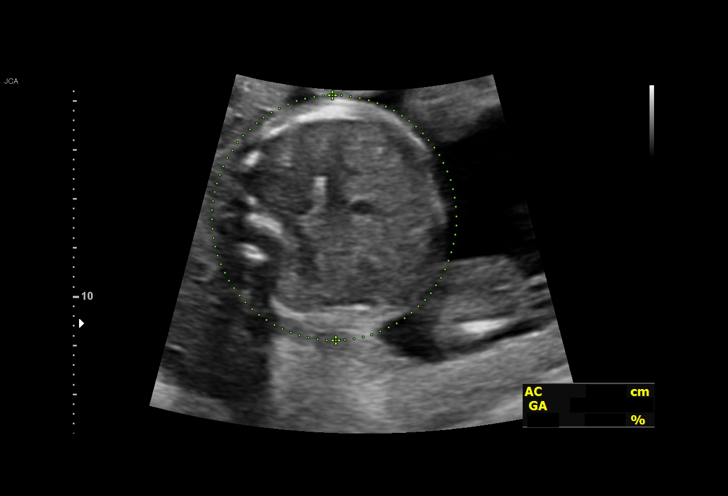
[im 72/89]
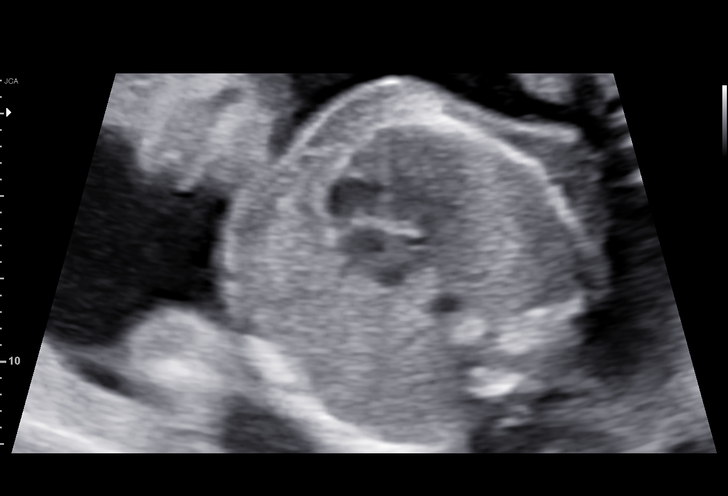
[im 79/89]
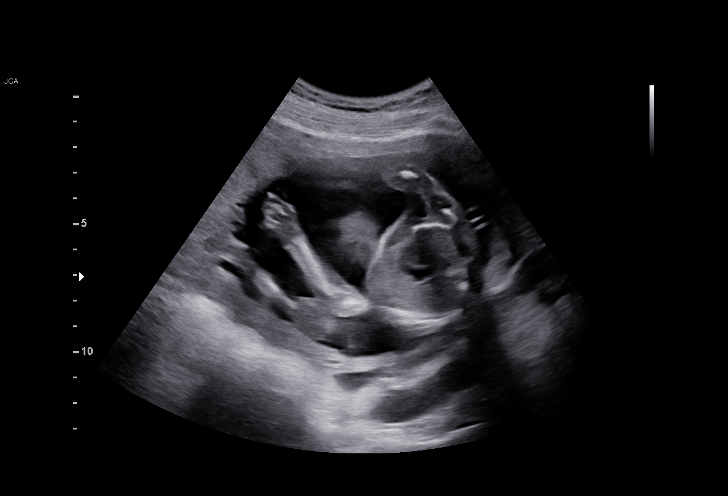
[im 85/89]
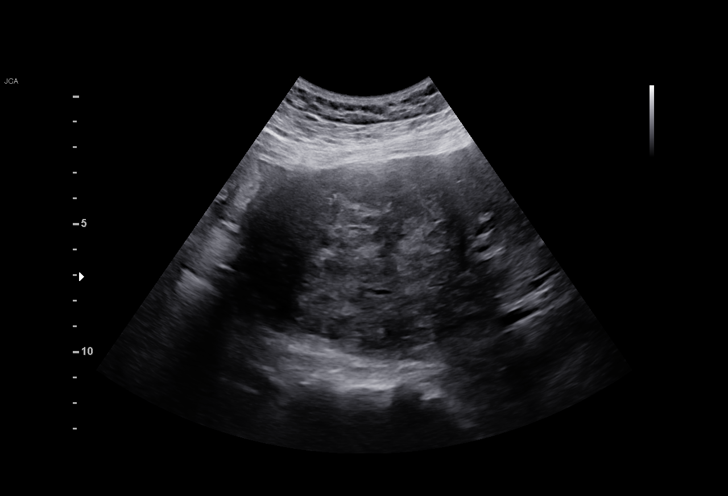

[12 of 28 positions shown; findings below may reference images not displayed]

OB/Gyn
                                                            OB/Gyn and
                                                            Infertility
                   [facility]                                  [facility]] at
                   [facility]
                   [REDACTED] #[id number]

                                                      [facility]

Indications

 Obesity complicating pregnancy, second
 trimester (Pre G BMI 36)
 Uterine fibroids affecting pregnancy in        O34.12,
 second trimester, antepartum
 19 weeks gestation of pregnancy
 Encounter for antenatal screening for
 malformations
Fetal Evaluation

 Num Of Fetuses:         1
 Fetal Heart Rate(bpm):  140
 Cardiac Activity:       Observed
 Presentation:           Breech
 Placenta:               Anterior Rt Lateral
 P. Cord Insertion:      Visualized
 Amniotic Fluid
 AFI FV:      Within normal limits

                             Largest Pocket(cm)

Biometry

 BPD:      46.6  mm     G. Age:  20w 1d         66  %    CI:        71.35   %    70 - 86
                                                         FL/HC:      18.4   %    16.8 -
 HC:      175.7  mm     G. Age:  20w 1d         60  %    HC/AC:      1.14        1.09 -
 AC:      154.8  mm     G. Age:  20w 5d         75  %    FL/BPD:     69.3   %
 FL:       32.3  mm     G. Age:  20w 0d         56  %    FL/AC:      20.9   %    20 - 24
 HUM:      30.6  mm     G. Age:  20w 1d         63  %
 CER:        21  mm     G. Age:  20w 0d         75  %
 NFT:       8.1  mm
 LV:        5.6  mm
 CM:        2.6  mm

 Est. FW:     348  gm    0 lb 12 oz      81  %
OB History

 Gravidity:    2
 Ectopic:      1        Living:  0
Gestational Age

 U/S Today:     20w 2d                                        EDD:   02/16/21
 Best:          19w 5d     Det. By:  Previous Ultrasound      EDD:   02/20/21
                                     (08/12/20)
Anatomy

 Cranium:               Appears normal         LVOT:                   Not well visualized
 Cavum:                 Appears normal         Aortic Arch:            Appears normal
 Ventricles:            Appears normal         Ductal Arch:            Appears normal
 Choroid Plexus:        Appears normal         Diaphragm:              Appears normal
 Cerebellum:            Appears normal         Stomach:                Appears normal, left
                                                                       sided
 Posterior Fossa:       Appears normal         Abdomen:                Appears normal
 Nuchal Fold:           Appears normal         Abdominal Wall:         Appears nml (cord
                                                                       insert, abd wall)
 Face:                  Appears normal         Cord Vessels:           Appears normal (3
                        (orbits and profile)                           vessel cord)
 Lips:                  Appears normal         Kidneys:                Not well visualized
 Palate:                Appears normal         Bladder:                Appears normal
 Thoracic:              Appears normal         Spine:                  Not well visualized
 Heart:                 Not well visualized    Upper Extremities:      Appears normal
 RVOT:                  Not well visualized    Lower Extremities:      Appears normal

 Other:  Fetus appears to be female. Nasal bone visualized. Technically
         difficult due to maternal habitus and fetal position.
Cervix Uterus Adnexa
 Cervix
 Length:           4.56  cm.
 Normal appearance by transabdominal scan.

 Uterus
 Single fibroid noted, see table below.

 Right Ovary
 Within normal limits.

 Left Ovary
 Within normal limits.

 Cul De Sac
 No free fluid seen.

 Adnexa
 No abnormality visualized. No adnexal mass
 visualized. No free fluid.
Myomas

 Site                     L(cm)      W(cm)      D(cm)       Location
 Fundal

 Blood Flow                  RI       PI       Comments

Comments

 This patient was seen for a detailed fetal anatomy scan due
 to maternal obesity and a fibroid uterus.
 She denies any significant past medical history and denies
 any problems in her current pregnancy.
 She has declined all screening tests for fetal aneuploidy in
 her current pregnancy.
 She was informed that the fetal growth and amniotic fluid
 level were appropriate for her gestational age.
 There were no obvious fetal anomalies noted on today's
 ultrasound exam.  However, the views of the fetal anatomy
 were limited today due to the fetal position.
 The patient was informed that anomalies may be missed due
 to technical limitations. If the fetus is in a suboptimal position
 or maternal habitus is increased, visualization of the fetus in
 the maternal uterus may be impaired.
 A 7 to 8 cm fibroid was noted in her uterus today.  The
 increased risk of maternal pain issues and fetal growth issues
 later in her pregnancy due to the fibroids was discussed.
 Due to her fibroid uterus, she should be followed with growth
 ultrasounds throughout her pregnancy.
 A follow-up exam was scheduled in 4 weeks to complete the
 views of the fetal anatomy.

## 2021-08-29 IMAGING — US US MFM OB FOLLOW-UP
1 series · 13 of 28 positions shown · non-contrast
Comparison: none

[Series 1: us mfm ob follow-up · 13 of 104 slices shown]
[im 4/104]
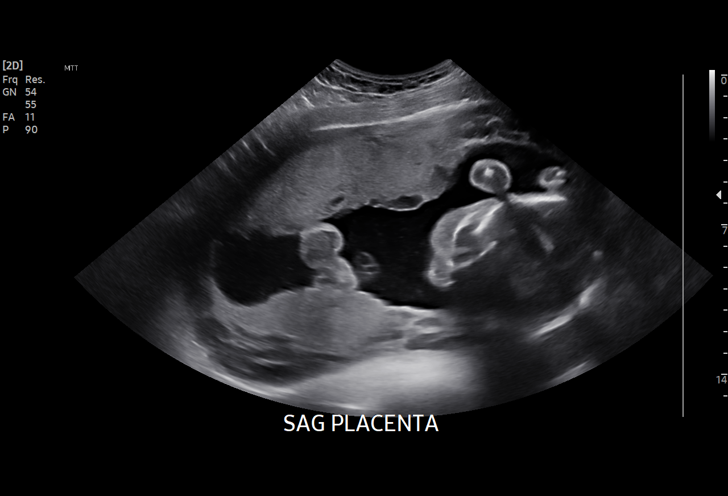
[im 12/104]
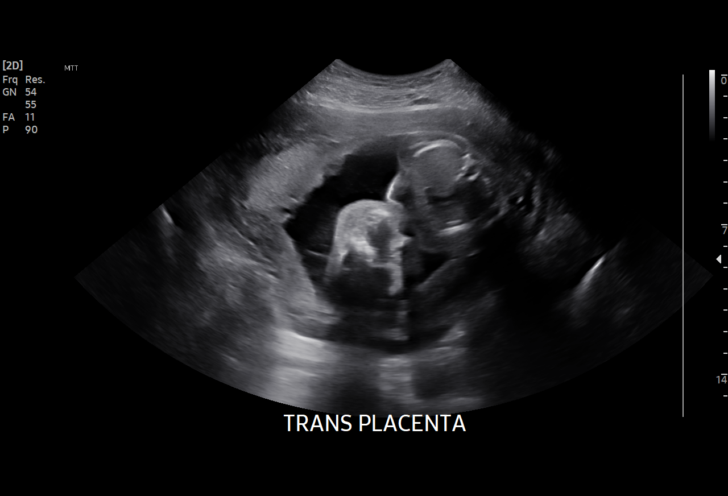
[im 20/104]
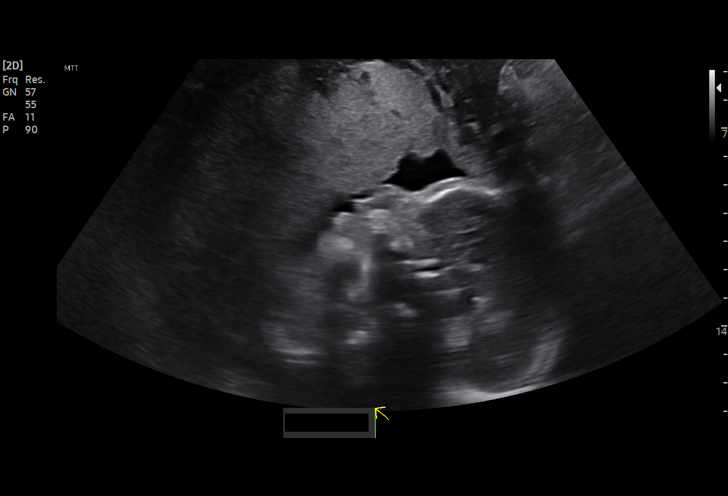
[im 27/104]
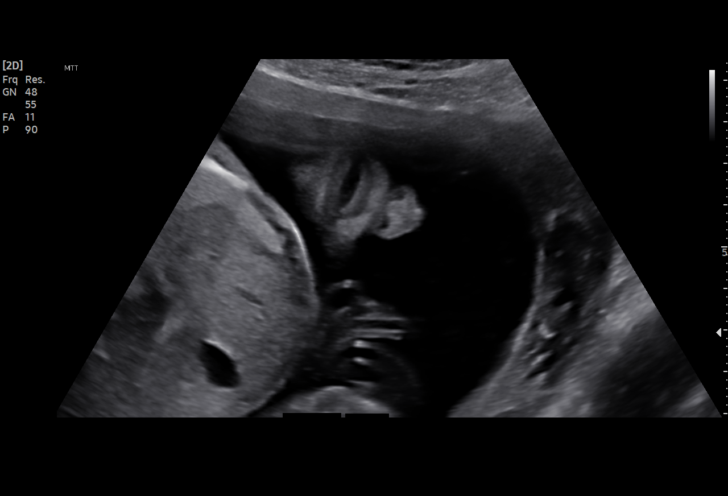
[im 35/104]
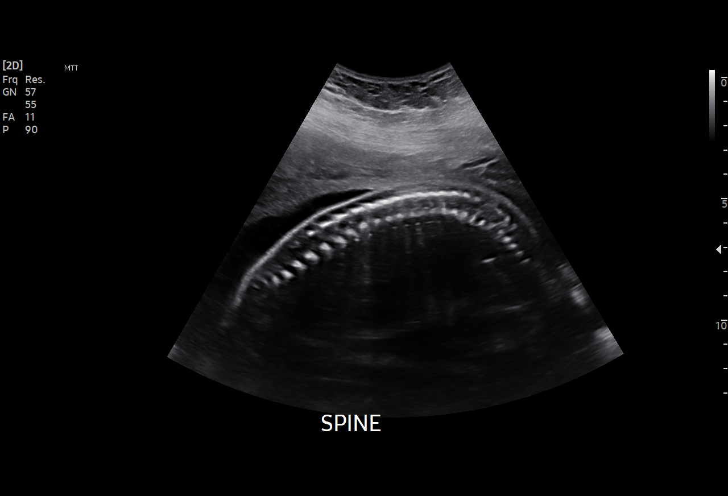
[im 42/104]
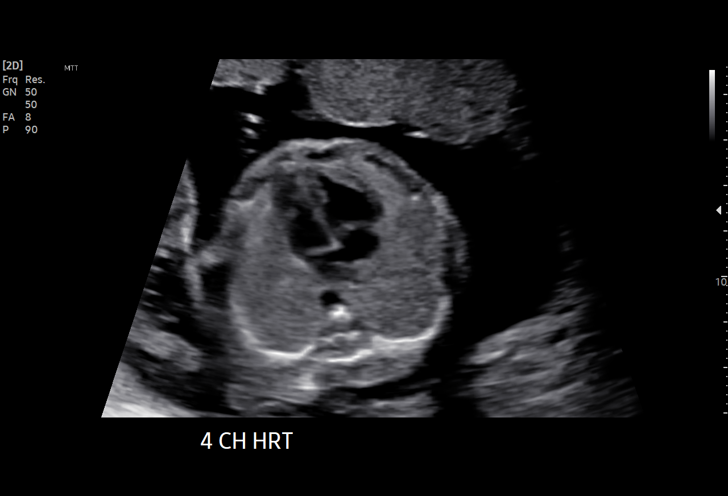
[im 54/104]
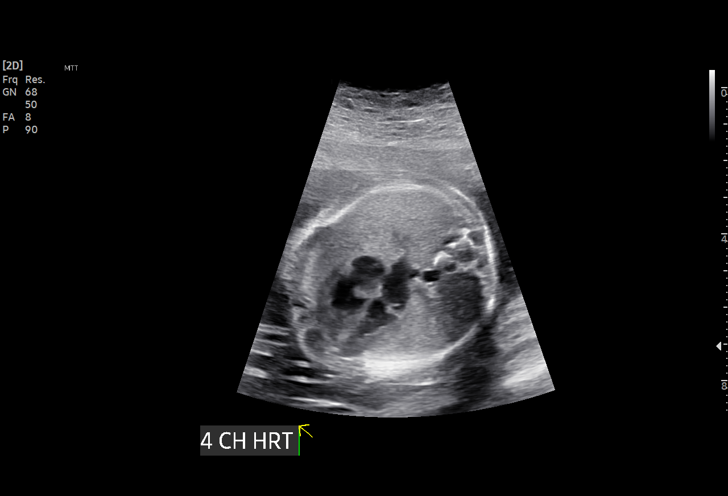
[im 62/104]
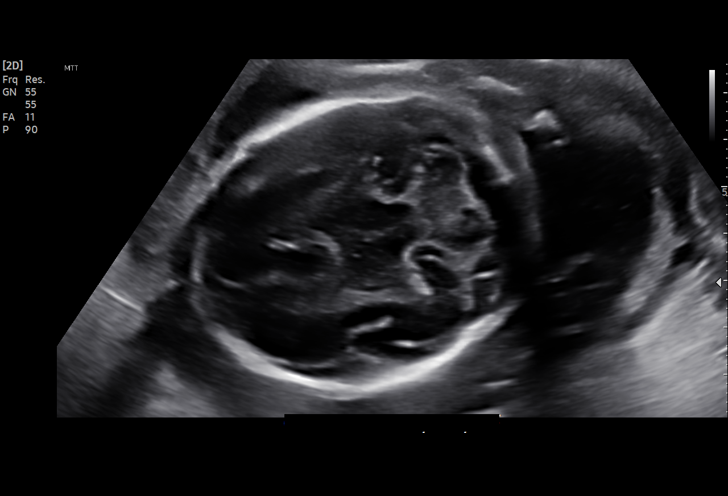
[im 69/104]
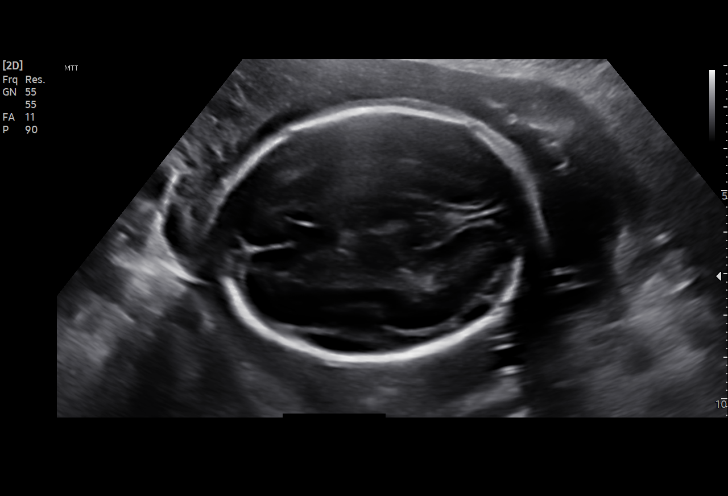
[im 77/104]
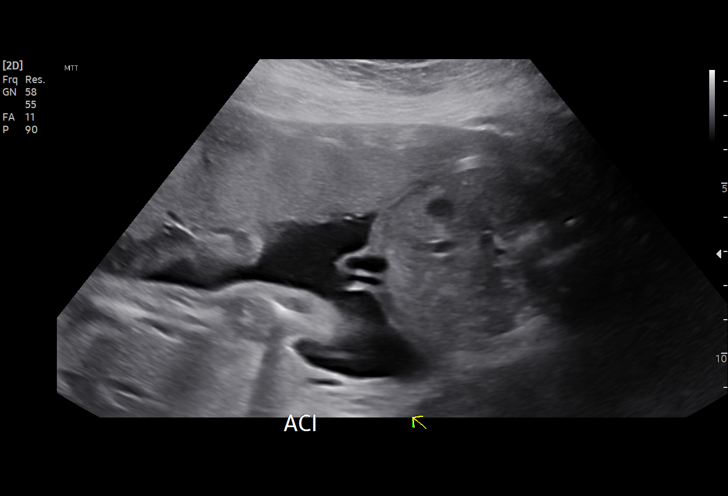
[im 84/104]
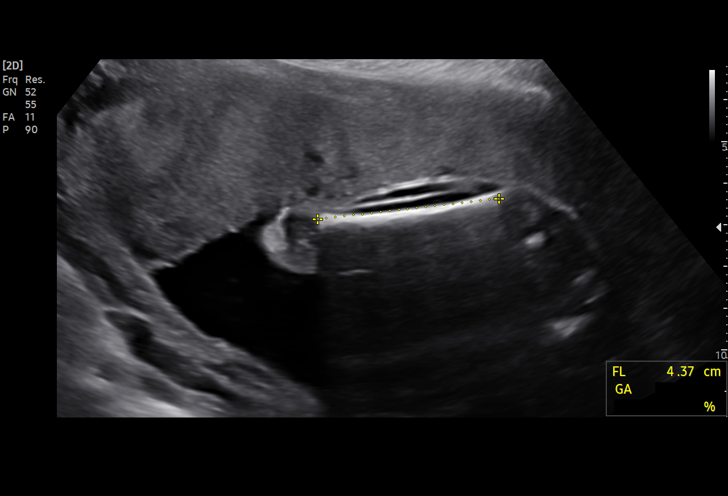
[im 92/104]
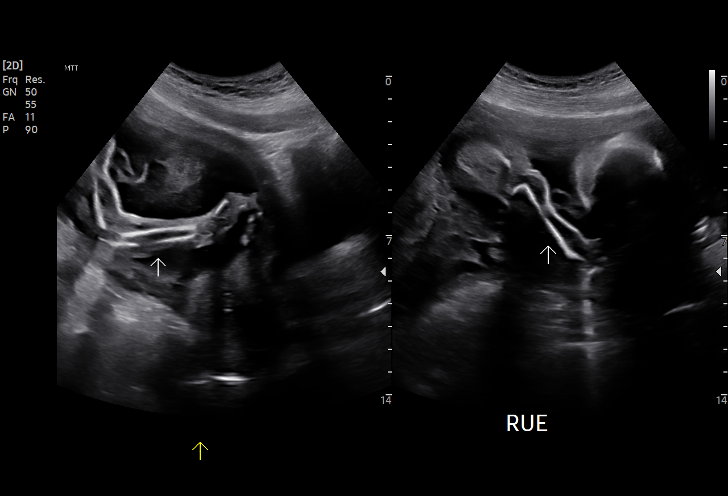
[im 100/104]
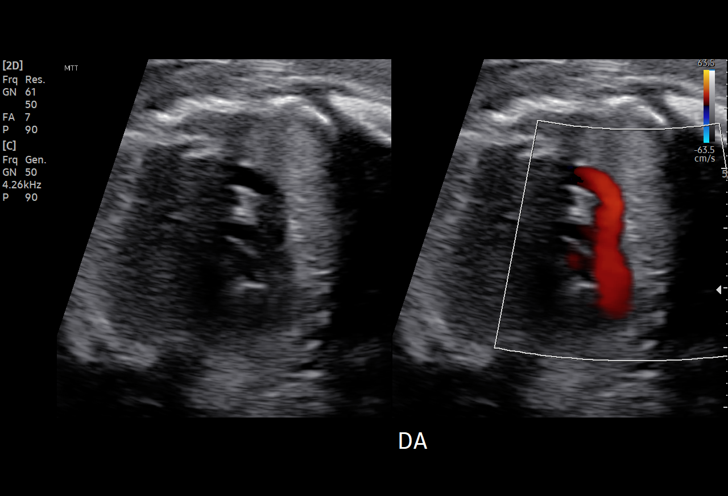

[13 of 28 positions shown; findings below may reference images not displayed]

OB/Gyn
                                                            OB/Gyn and
                                                            Infertility
                   [facility]                                  [HOS[facility]] at
                   OB[facility]
                   [REDACTED] #[id number]

Indications

 23 weeks gestation of pregnancy
 Obesity complicating pregnancy, second
 trimester (Pre G BMI 36)
 Uterine fibroids affecting pregnancy in        O34.12,
 second trimester, antepartum
 Encounter for antenatal screening for
 malformations
 Abnormal fetal ultrasound (hypoplastic nasal
 bone)
Fetal Evaluation

 Num Of Fetuses:         1
 Fetal Heart Rate(bpm):  152
 Cardiac Activity:       Observed
 Presentation:           Cephalic
 Placenta:               Anterior Right lateral
 P. Cord Insertion:      Previously Visualized
 Amniotic Fluid
 AFI FV:      Within normal limits
OB History

 Gravidity:    2
 Ectopic:      1        Living:  0
Gestational Age

 Best:          23w 5d     Det. By:  Previous Ultrasound      EDD:   02/20/21
                                     (08/12/20)
Anatomy

 Cranium:               Appears normal         Aortic Arch:            Appears normal
 Cavum:                 Appears normal         Ductal Arch:            Appears normal
 Ventricles:            Appears normal         Diaphragm:              Appears normal
 Choroid Plexus:        Appears normal         Stomach:                Appears normal, left
                                                                       sided
 Cerebellum:            Appears normal         Abdomen:                Appears normal
 Posterior Fossa:       Appears normal         Abdominal Wall:         Appears nml (cord
                                                                       insert, abd wall)
 Nuchal Fold:           Previously seen        Cord Vessels:           Appears normal (3
                                                                       vessel cord)
 Face:                  Hypoplastic nasal      Kidneys:                Appear normal
                        bone
 Lips:                  Appears normal         Bladder:                Appears normal
 Thoracic:              Appears normal         Spine:                  Appears normal
 Heart:                 Appears normal         Upper Extremities:      Appears normal
                        (4CH, axis, and
                        situs)
 RVOT:                  Appears normal         Lower Extremities:      Appears normal
 LVOT:                  Appears normal

 Other:  Fetus appears to be female. Short Nasal bone visualized. Lenses
         visualized. Heels/feet and open hands/5th digits visualized.
Cervix Uterus Adnexa

 Cervix
 Normal appearance by transabdominal scan.

 Uterus
 No abnormality visualized.

 Right Ovary
 Within normal limits.

 Left Ovary
 Within normal limits.

 Cul De Sac
 No free fluid seen.

 Adnexa
 No abnormality visualized.
Myomas

 Site                     L(cm)      W(cm)      D(cm)       Location
 Left                     8.6        7.5        9.2         Intramural

 Blood Flow                  RI       PI       Comments

Impression

 Patient returned for completion of fetal anatomy scan.  She
 had opted not to screen for fetal aneuploidies.
 Fetal growth is appropriate for gestational age.  Amniotic fluid
 is normal and good fetal activity seen.  Hypoplastic nasal
 bone is seen.  No other markers of aneuploidies of obvious
 fetal structural defects are seen.  Fetal anatomical survey
 appears normal.
 A large left intramural myoma is seen (measurements above)
 I explained that hypoplastic nasal bone is a marker for Down
 syndrome.  It is also more common in euploid fetuses in
 African-American population.
 I discussed cell free fetal DNA screening for Down syndrome.
 I informed her that only amniocentesis will give a definitive
 result on fetal karyotype.
 Patient opted not to have amniocentesis.  She she has not
 made a decision on cell free fetal DNA screening.
 Patient does not have symptoms pertaining to the myoma.
Recommendations

 -An appointment was made for her to return in 8 weeks for
 fetal growth assessment.
                 Mulard, Peroumal

## 2021-10-08 ENCOUNTER — Other Ambulatory Visit (HOSPITAL_COMMUNITY): Payer: Self-pay | Admitting: Obstetrics and Gynecology

## 2021-10-08 DIAGNOSIS — M79661 Pain in right lower leg: Secondary | ICD-10-CM

## 2021-10-09 ENCOUNTER — Other Ambulatory Visit: Payer: Self-pay | Admitting: Obstetrics and Gynecology

## 2021-10-09 ENCOUNTER — Ambulatory Visit (HOSPITAL_COMMUNITY): Payer: Medicaid Other

## 2021-10-09 DIAGNOSIS — Z363 Encounter for antenatal screening for malformations: Secondary | ICD-10-CM

## 2021-10-16 ENCOUNTER — Telehealth: Payer: Self-pay | Admitting: Hematology and Oncology

## 2021-10-16 NOTE — Telephone Encounter (Signed)
Scheduled appt per 4/5 referral. Pt is aware of appt date and time. Pt is aware to arrive 15 mins prior to appt time and to bring and updated insurance card. Pt is aware of appt location.   ?

## 2021-10-29 ENCOUNTER — Telehealth: Payer: Self-pay | Admitting: Pediatrics

## 2021-10-30 ENCOUNTER — Ambulatory Visit: Payer: Medicaid Other

## 2021-10-31 ENCOUNTER — Inpatient Hospital Stay: Payer: Medicaid Other | Attending: Hematology and Oncology | Admitting: Hematology and Oncology

## 2021-10-31 ENCOUNTER — Other Ambulatory Visit: Payer: Medicaid Other

## 2021-10-31 ENCOUNTER — Telehealth: Payer: Self-pay

## 2021-10-31 NOTE — Telephone Encounter (Signed)
Attempt x 3 to connect with pt regarding appointment today with Dr Chryl Heck.  Pt phone was not ringing.  I also tried to contact her significant other listed and it went straight to VM.  Message left for a return call to [phone].   ?

## 2021-11-04 ENCOUNTER — Telehealth: Payer: Self-pay | Admitting: Hematology and Oncology

## 2021-11-04 NOTE — Telephone Encounter (Signed)
.  Called patient to schedule appointment per 5/2 inbasket, patient was unavailable , spoke with pt significant other who informed me that they were out of the area and did not need to r/s, desk nurse notified ?

## 2021-11-05 NOTE — Telephone Encounter (Signed)
Phone note mistakenly opened when reviewing data for care of infant. ?Kellie Simmering, MD ?

## 2021-12-02 IMAGING — US US MFM FETAL BPP W/O NON-STRESS
1 series · 15 of 27 positions shown · non-contrast
Comparison: none

[Series 1: us mfm fetal bpp w/o non-stress · 27 acquisitions, 15 frames shown]
[im 1/27]
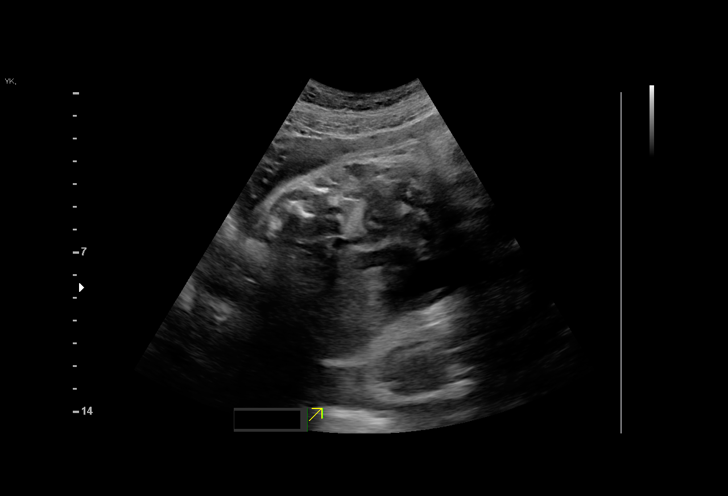
[im 3/27]
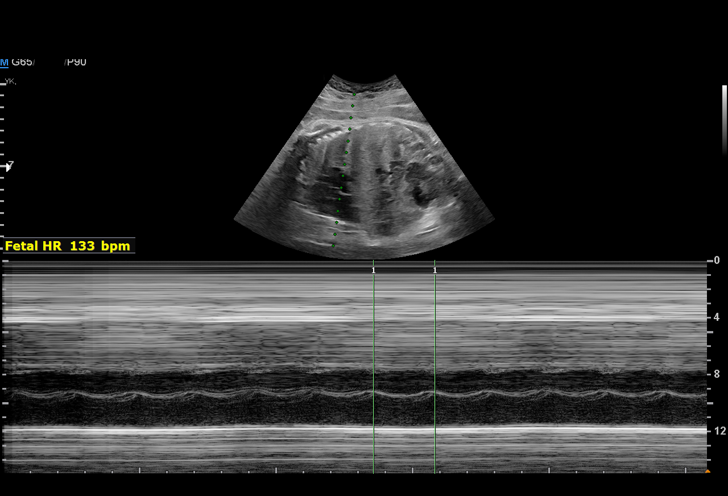
[im 5/27]
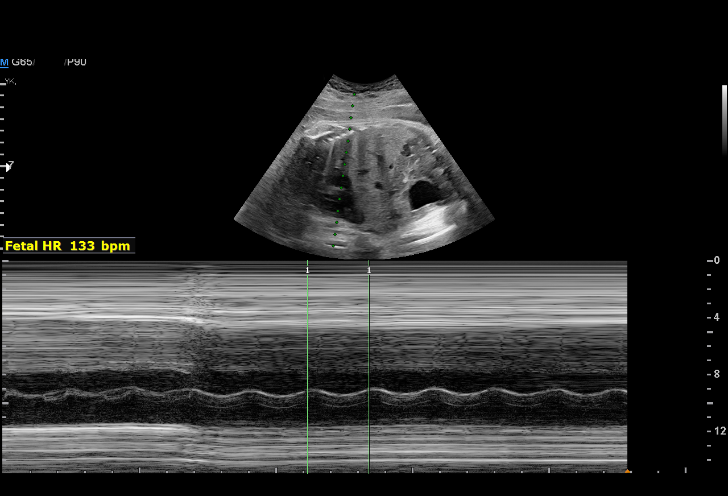
[im 7/27]
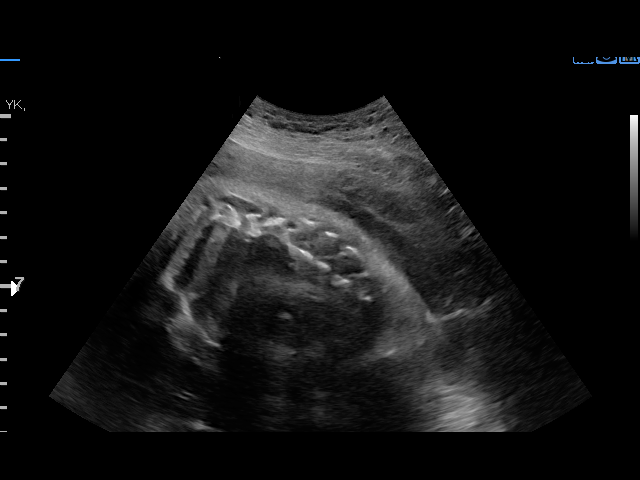
[im 9/27]
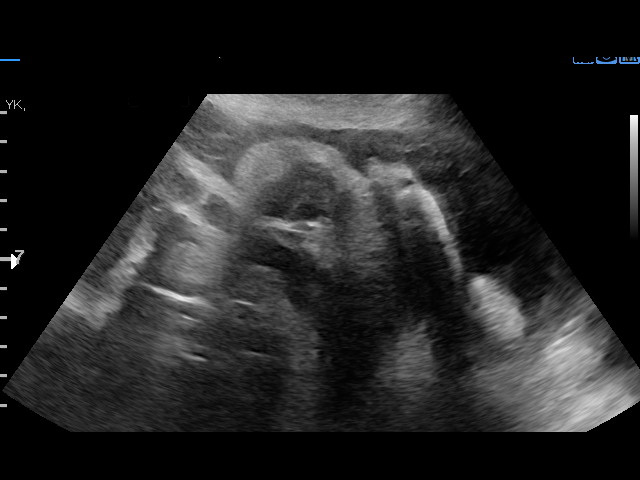
[im 10/27]
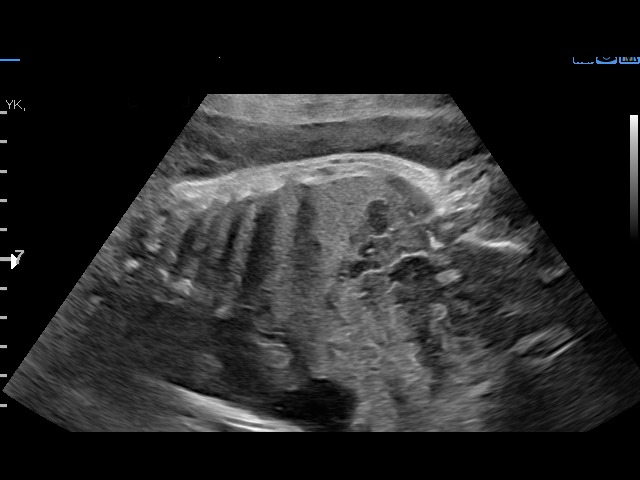
[im 12/27]
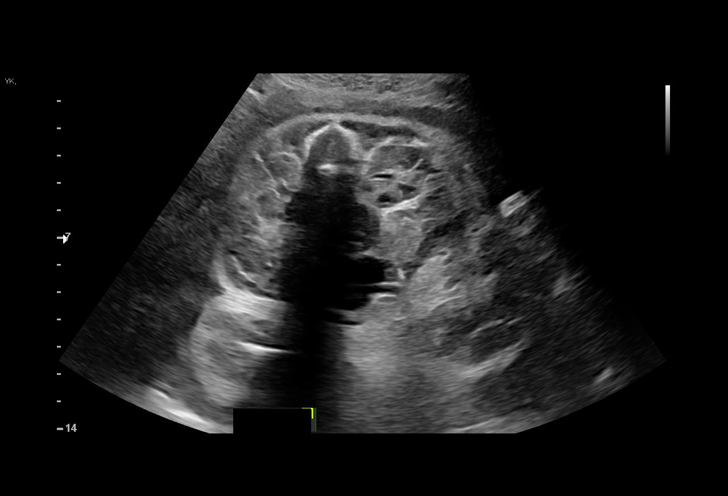
[im 14/27]
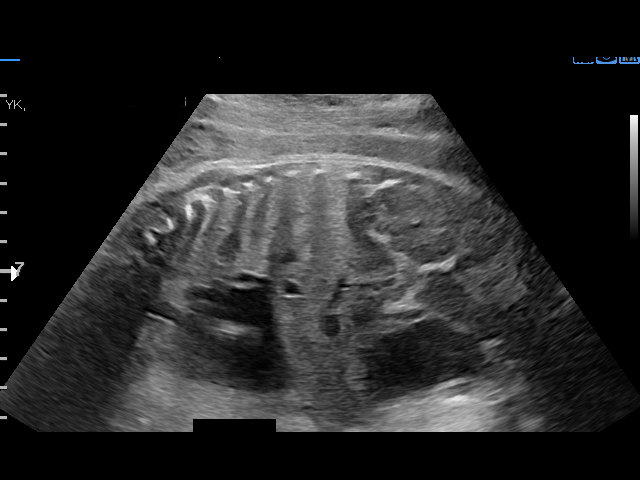
[im 16/27]
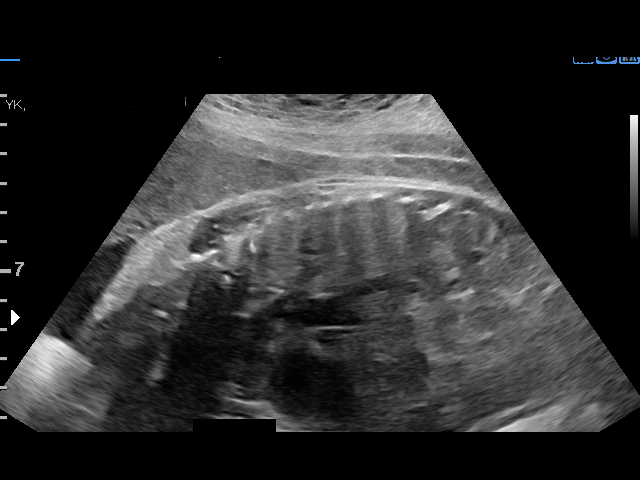
[im 18/27]
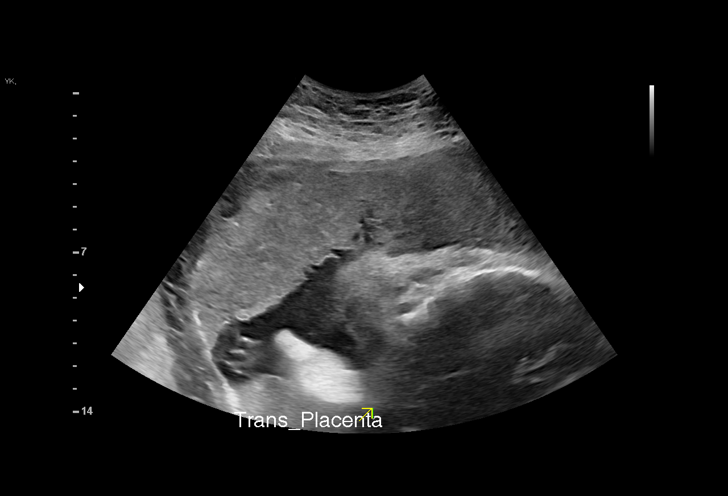
[im 19/27]
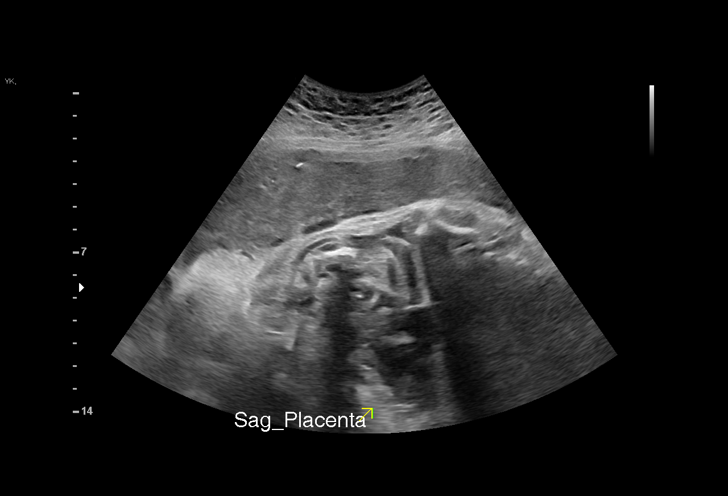
[im 21/27]
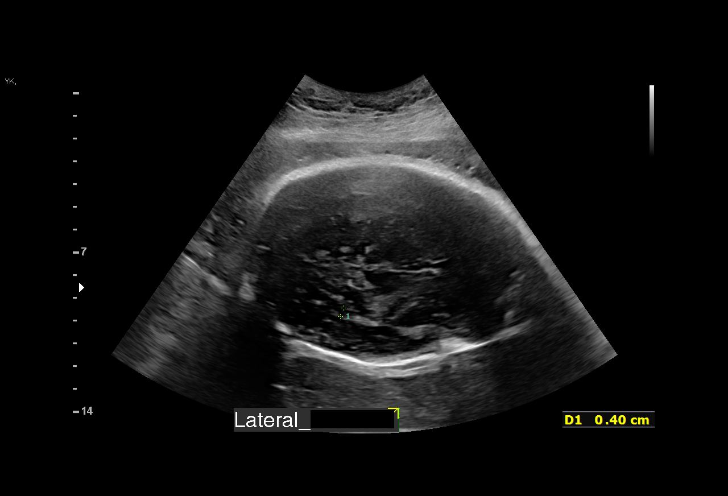
[im 23/27]
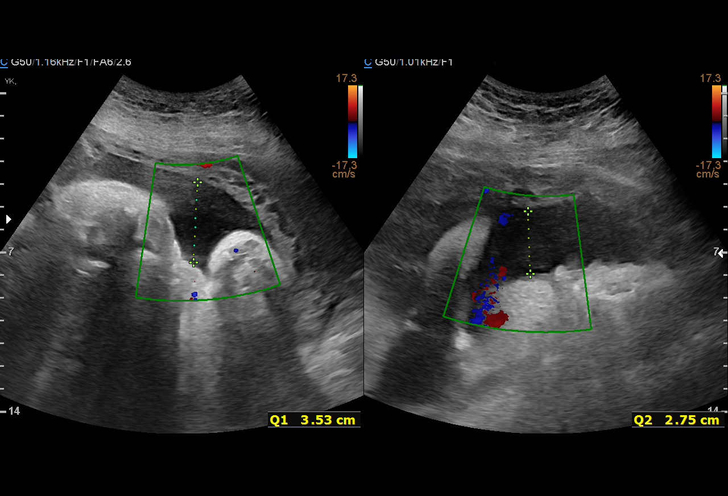
[im 25/27]
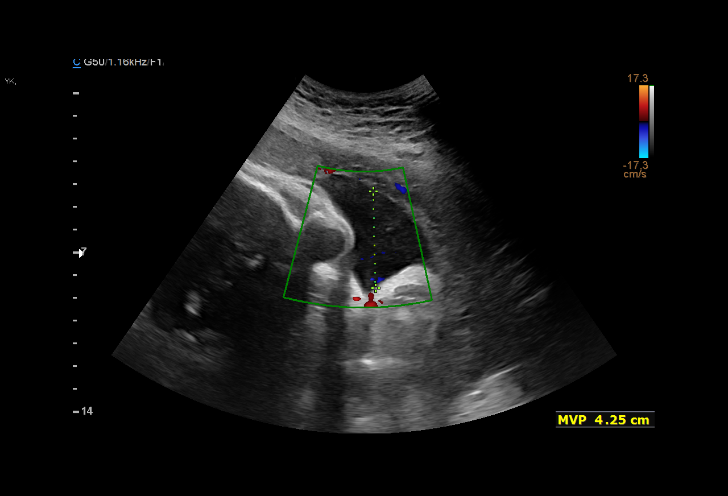
[im 27/27]
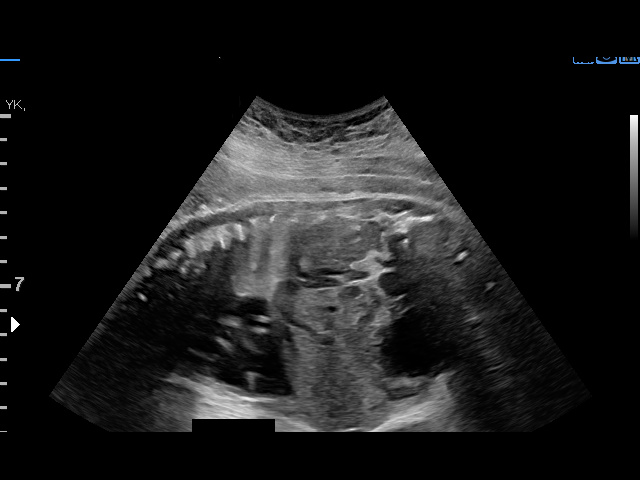

[15 of 27 positions shown; findings below may reference images not displayed]

OB/Gyn
                                                             OB/Gyn and
                                                             Infertility
                   [facility]                                   [HOSP[facility]]
                   [facility]
                   [REDACTED] #[id number]

Indications

 Decreased fetal movement
 Obesity complicating pregnancy, third
 trimester (pregravid BMI 36)
 Uterine fibroids affecting pregnancy in third   O34.13,
 trimester, antepartum
 Encounter for other antenatal screening
 follow-up
 Abnormal fetal ultrasound (hypoplastic nasal
 bone)
 37 weeks gestation of pregnancy
Fetal Evaluation

 Num Of Fetuses:          1
 Fetal Heart Rate(bpm):   133
 Cardiac Activity:        Observed
 Presentation:            Breech
 Placenta:                Anterior
 P. Cord Insertion:       Previously Visualized
 Amniotic Fluid
 AFI FV:      Within normal limits

 AFI Sum(cm)     %Tile       Largest Pocket(cm)
 10.4            27

 RUQ(cm)       RLQ(cm)       LUQ(cm)        LLQ(cm)

Biophysical Evaluation

 Amniotic F.V:   Within normal limits       F. Tone:         Observed
 F. Movement:    Observed                   Score:           [DATE]
 F. Breathing:   Observed
OB History

 Gravidity:    2
 Ectopic:      1         Living: 0
Gestational Age

 Best:          37w 2d     Det. By:  Previous Ultrasound      EDD:   02/20/21
                                     (08/12/20)
Anatomy

 Cranium:               Appears normal         Stomach:                Appears normal, left
                                                                       sided
 Cavum:                 Appears normal         Kidneys:                Appear normal
 Ventricles:            Appears normal         Bladder:                Appears normal
Myomas

 Site                     L(cm)      W(cm)       D(cm)      Location
 Lt fundus

 Blood Flow                  RI       PI       Comments

Impression

 Antenatal testing performed given maternal decreased fetal
 movement
 The biophysical profile was [DATE] with good fetal movement and
 amniotic fluid volume.
Recommendations

 Clinical correlation recommended.
